# Patient Record
Sex: Female | Born: 1970 | Race: Black or African American | Hispanic: No | Marital: Single | Smoking: Never smoker
Health system: Southern US, Community
[De-identification: ages and names within clinical notes are randomized; demographics above are authoritative.]

## PROBLEM LIST (undated history)

## (undated) DIAGNOSIS — G8929 Other chronic pain: Secondary | ICD-10-CM

## (undated) DIAGNOSIS — M542 Cervicalgia: Secondary | ICD-10-CM

## (undated) DIAGNOSIS — R51 Headache: Secondary | ICD-10-CM

## (undated) DIAGNOSIS — G709 Myoneural disorder, unspecified: Secondary | ICD-10-CM

## (undated) HISTORY — PX: SHOULDER SURGERY: SHX246

---

## 2003-11-27 ENCOUNTER — Encounter: Admission: RE | Admit: 2003-11-27 | Discharge: 2003-12-14 | Payer: Self-pay | Admitting: Family Medicine

## 2004-04-10 ENCOUNTER — Emergency Department (HOSPITAL_COMMUNITY): Admission: EM | Admit: 2004-04-10 | Discharge: 2004-04-11 | Payer: Self-pay | Admitting: Emergency Medicine

## 2004-04-12 ENCOUNTER — Ambulatory Visit (HOSPITAL_COMMUNITY): Admission: EM | Admit: 2004-04-12 | Discharge: 2004-04-13 | Payer: Self-pay | Admitting: Orthopedic Surgery

## 2005-08-11 ENCOUNTER — Ambulatory Visit: Payer: Self-pay | Admitting: Family Medicine

## 2005-09-24 ENCOUNTER — Ambulatory Visit: Payer: Self-pay | Admitting: Family Medicine

## 2006-04-27 ENCOUNTER — Ambulatory Visit: Payer: Self-pay | Admitting: Family Medicine

## 2006-08-30 ENCOUNTER — Encounter: Payer: Self-pay | Admitting: Family Medicine

## 2006-08-30 ENCOUNTER — Other Ambulatory Visit: Admission: RE | Admit: 2006-08-30 | Discharge: 2006-08-30 | Payer: Self-pay | Admitting: Family Medicine

## 2006-08-30 ENCOUNTER — Ambulatory Visit: Payer: Self-pay | Admitting: Family Medicine

## 2006-08-30 ENCOUNTER — Encounter (INDEPENDENT_AMBULATORY_CARE_PROVIDER_SITE_OTHER): Payer: Self-pay | Admitting: *Deleted

## 2006-08-30 LAB — CONVERTED CEMR LAB: Pap Smear: NORMAL

## 2007-09-15 ENCOUNTER — Encounter: Payer: Self-pay | Admitting: Family Medicine

## 2007-12-30 ENCOUNTER — Ambulatory Visit (HOSPITAL_COMMUNITY): Admission: RE | Admit: 2007-12-30 | Discharge: 2007-12-30 | Payer: Self-pay | Admitting: Family Medicine

## 2007-12-30 ENCOUNTER — Ambulatory Visit: Payer: Self-pay | Admitting: Family Medicine

## 2007-12-30 LAB — CONVERTED CEMR LAB
Basophils Absolute: 0 10*3/uL (ref 0.0–0.1)
Basophils Relative: 0 % (ref 0–1)
Eosinophils Absolute: 0.1 10*3/uL (ref 0.0–0.7)
Hemoglobin: 11.4 g/dL — ABNORMAL LOW (ref 12.0–15.0)
Lymphocytes Relative: 15 % (ref 12–46)
MCV: 84.4 fL (ref 78.0–100.0)
Platelets: 277 10*3/uL (ref 150–400)
RBC: 4.17 M/uL (ref 3.87–5.11)
RDW: 14 % (ref 11.5–15.5)
WBC: 13.7 10*3/uL — ABNORMAL HIGH (ref 4.0–10.5)

## 2008-01-06 ENCOUNTER — Encounter (INDEPENDENT_AMBULATORY_CARE_PROVIDER_SITE_OTHER): Payer: Self-pay | Admitting: *Deleted

## 2008-01-06 DIAGNOSIS — H6690 Otitis media, unspecified, unspecified ear: Secondary | ICD-10-CM | POA: Insufficient documentation

## 2008-02-22 ENCOUNTER — Ambulatory Visit: Payer: Self-pay | Admitting: Family Medicine

## 2008-02-22 ENCOUNTER — Other Ambulatory Visit: Admission: RE | Admit: 2008-02-22 | Discharge: 2008-02-22 | Payer: Self-pay | Admitting: Family Medicine

## 2008-02-22 ENCOUNTER — Encounter: Payer: Self-pay | Admitting: Family Medicine

## 2008-04-12 ENCOUNTER — Telehealth: Payer: Self-pay | Admitting: Family Medicine

## 2008-09-28 ENCOUNTER — Ambulatory Visit: Payer: Self-pay | Admitting: Family Medicine

## 2008-09-28 DIAGNOSIS — J309 Allergic rhinitis, unspecified: Secondary | ICD-10-CM | POA: Insufficient documentation

## 2008-09-28 DIAGNOSIS — G44009 Cluster headache syndrome, unspecified, not intractable: Secondary | ICD-10-CM | POA: Insufficient documentation

## 2009-07-12 IMAGING — CR DG CHEST 2V
2 series · 2 of 2 positions shown · non-contrast
Comparison: None

CLINICAL DATA: Bronchitis, cough, congestion, fever, posterior
chest pain

CHEST - 2 VIEW

[view not recorded (1 of 2)]
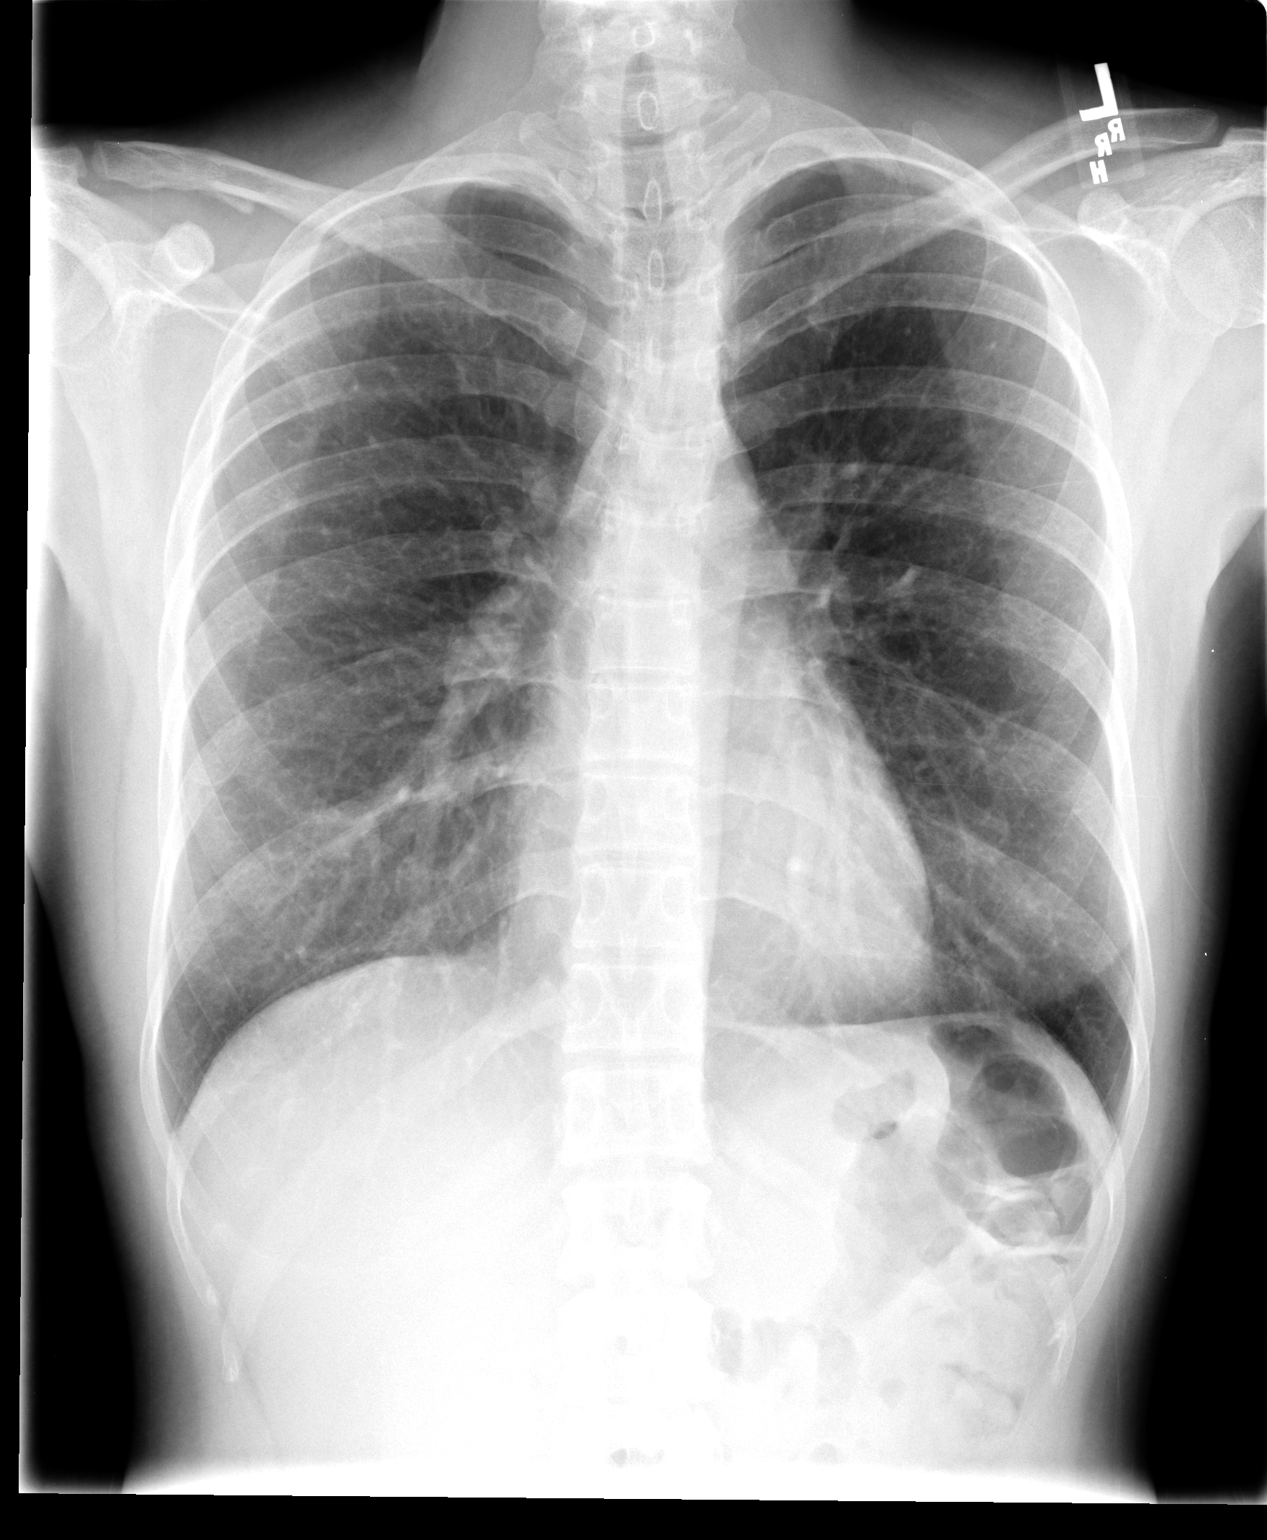

[view not recorded (2 of 2)]
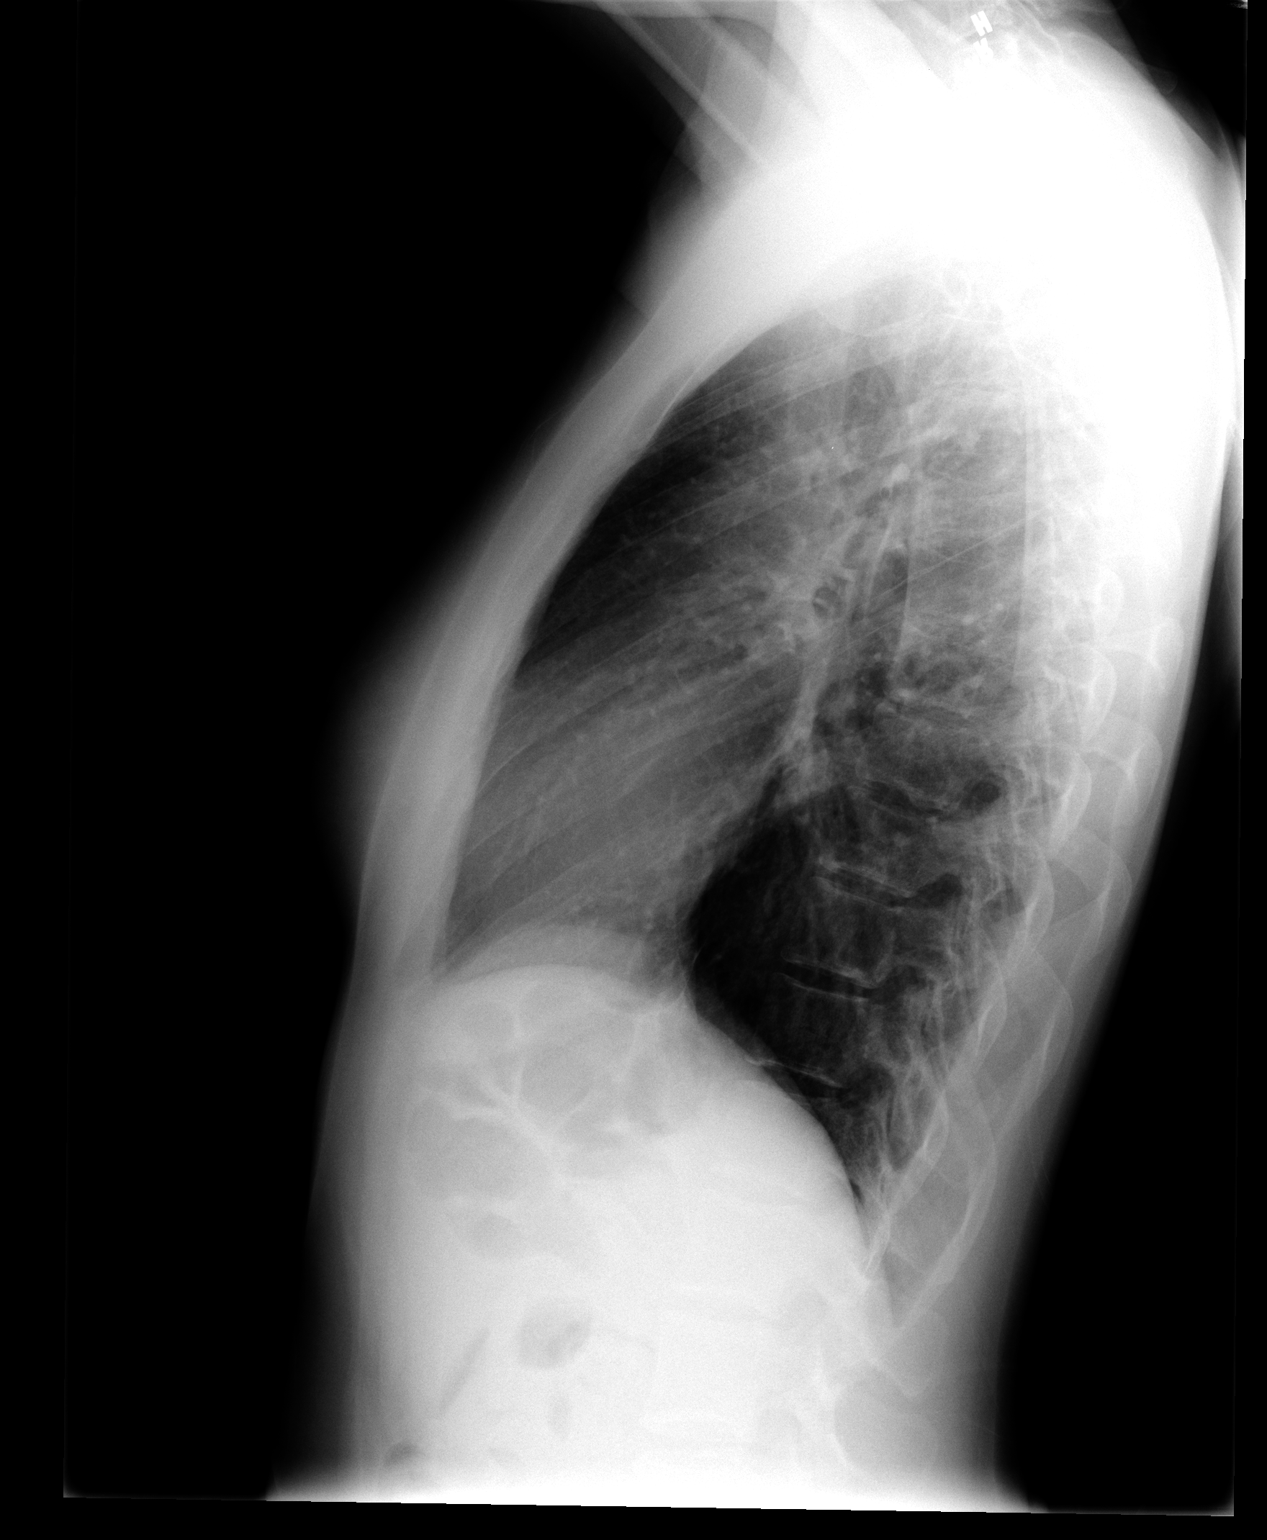

[2 of 2 positions shown; findings below may reference images not displayed]

FINDINGS: Normal heart size, mediastinal contours, and pulmonary vascularity.
Mild bronchitic changes without segmental infiltrate or pleural
effusion.
Very minimal interstitial prominence.
No pneumothorax or focal bone abnormality.
IMPRESSION: Mild bronchitic changes.

## 2009-07-26 ENCOUNTER — Telehealth: Payer: Self-pay | Admitting: Family Medicine

## 2009-10-01 ENCOUNTER — Encounter (INDEPENDENT_AMBULATORY_CARE_PROVIDER_SITE_OTHER): Payer: Self-pay | Admitting: *Deleted

## 2009-12-19 ENCOUNTER — Emergency Department (HOSPITAL_BASED_OUTPATIENT_CLINIC_OR_DEPARTMENT_OTHER): Admission: EM | Admit: 2009-12-19 | Discharge: 2009-12-19 | Payer: Self-pay | Admitting: Emergency Medicine

## 2009-12-20 ENCOUNTER — Ambulatory Visit: Payer: Self-pay | Admitting: Family Medicine

## 2009-12-20 DIAGNOSIS — J039 Acute tonsillitis, unspecified: Secondary | ICD-10-CM | POA: Insufficient documentation

## 2009-12-23 ENCOUNTER — Encounter: Payer: Self-pay | Admitting: Physician Assistant

## 2009-12-23 ENCOUNTER — Ambulatory Visit: Payer: Self-pay | Admitting: Family Medicine

## 2009-12-23 ENCOUNTER — Other Ambulatory Visit: Admission: RE | Admit: 2009-12-23 | Discharge: 2009-12-23 | Payer: Self-pay | Admitting: Family Medicine

## 2009-12-23 DIAGNOSIS — E785 Hyperlipidemia, unspecified: Secondary | ICD-10-CM | POA: Insufficient documentation

## 2009-12-23 LAB — CONVERTED CEMR LAB
Basophils Absolute: 0 10*3/uL (ref 0.0–0.1)
Basophils Relative: 0 % (ref 0–1)
Eosinophils Relative: 0 % (ref 0–5)
Hemoglobin: 11.3 g/dL — ABNORMAL LOW (ref 12.0–15.0)
Mono Screen: NEGATIVE
Neutrophils Relative %: 74 % (ref 43–77)
Platelets: 248 10*3/uL (ref 150–400)
RBC: 4.21 M/uL (ref 3.87–5.11)
RDW: 14.5 % (ref 11.5–15.5)

## 2010-01-07 ENCOUNTER — Encounter: Payer: Self-pay | Admitting: Family Medicine

## 2010-01-20 ENCOUNTER — Encounter: Payer: Self-pay | Admitting: Family Medicine

## 2010-01-20 ENCOUNTER — Encounter: Payer: Self-pay | Admitting: Physician Assistant

## 2010-01-20 LAB — CONVERTED CEMR LAB: Pap Smear: UNDETERMINED

## 2010-01-23 ENCOUNTER — Telehealth: Payer: Self-pay | Admitting: Physician Assistant

## 2010-06-06 ENCOUNTER — Encounter: Payer: Self-pay | Admitting: Family Medicine

## 2010-10-14 NOTE — Letter (Signed)
Summary: consults  consults   Imported By: Lind Guest 03/07/2010 15:51:47  _____________________________________________________________________  External Attachment:    Type:   Image     Comment:   External Document

## 2010-10-14 NOTE — Letter (Signed)
Summary: Laboratory/X-Ray Results  [facility]  [address]   [place], [place] [address]   Phone: [phone]  Fax: [phone]    Lab/X-Ray Results  Jan 20, 2010  MRN: [id number]  Carla [name] [address] [place], [place]  [address]   Chip Boer -  I have tried to reach you by phone and mail to discuss your pap smear results.  Your pap smear did show a mild abnomality.  I need to discuss these results with your and refer you to a gynecologist for further testing/evaluation.  Please contact the office as soon as possible at [phone].    Esperanza Sheets PA  Appended Document: Laboratory/X-Ray Results Sent Certified Mail.

## 2010-10-14 NOTE — Assessment & Plan Note (Signed)
Summary: sick- room 1   Vital Signs:  Patient profile:   40 year old female Height:      64.5 inches Weight:      125.75 pounds BMI:     21.33 O2 Sat:      99 % on Room air Pulse rate:   100 / minute Resp:     16 per minute BP sitting:   120 / 70  (left arm)  Vitals Entered By: Adella Hare LPN (December 21, 6043 10:37 AM) CC: sore throat, body aches, headaches Is Patient Diabetic? No Pain Assessment Patient in pain? no        CC:  sore throat, body aches, and headaches.  History of Present Illness: Pt presents today with c/o swelling & sore thrt Rt side only.  Also body aches.  Started 2 days ago. Yest felt worse so went to urgent care.  Dx of strep throat, but didnt have a strep test.  Was given shot of Penicillin & told her she'd feel better today.  Pt states she feels worse today.  Swelling in throat is worse, thrt still sore, some post nasal drainage. Still has body aches, & running low grade fever.  She is using otc pain relievers without much help.  Current Medications (verified): 1)  Flonase 50 Mcg/act Susp (Fluticasone Propionate) .... Two Puffs Per Nostril Qd 2)  Fioricet 50-325-40 Mg Tabs (Butalbital-Apap-Caffeine) .... One Tab By Mouth Two Times A Day As Needed Severe Headache 3)  Ibuprofen 800 Mg Tabs (Ibuprofen) .... One Tab By Mouth Two Times A Day Prn  Allergies (verified): No Known Drug Allergies  Past History:  Past medical history reviewed for relevance to current acute and chronic problems.  Past Medical History: Reviewed history from 09/28/2008 and no changes required. Current Problems:  OTITIS MEDIA, ACUTE, RIGHT (ICD-382.9) migraine headache  2002 left TMJ  09/2008  Review of Systems General:  Complains of chills and fever. ENT:  Complains of earache, postnasal drainage, and sore throat; denies nasal congestion and sinus pressure. CV:  Denies chest pain or discomfort. Resp:  Denies shortness of breath. GI:  Complains of loss of appetite;  denies nausea and vomiting.  Physical Exam  General:  well-developed, well-nourished, well-hydrated, and uncomfortable-appearing.   Head:  Normocephalic and atraumatic without obvious abnormalities. No apparent alopecia or balding. Ears:  External ear exam shows no significant lesions or deformities.  Otoscopic examination reveals clear canals, tympanic membranes are intact bilaterally without bulging, retraction, inflammation or discharge. Hearing is grossly normal bilaterally. Nose:  External nasal examination shows no deformity or inflammation. Nasal mucosa are pink and moist without lesions or exudates. Mouth:  good dentition and no postnasal drip.  Rt tonsil is 2+ with exudate, erythema noted of soft palate in immediate area of tonsil but no swelling or diplacement of uvula noted.  Lt tonsil 1+ with exudate, though less than Rt. Neck:  No deformities, masses, or tenderness noted. Lungs:  Normal respiratory effort, chest expands symmetrically. Lungs are clear to auscultation, no crackles or wheezes. Heart:  Normal rate and regular rhythm. S1 and S2 normal without gallop, murmur, click, rub or other extra sounds. Abdomen:  soft, non-tender, no hepatomegaly, and no splenomegaly.   Cervical Nodes:  Shotty bilat tonsilar nodes.  No cervical adenopathy noted. Psych:  Cognition and judgment appear intact. Alert and cooperative with normal attention span and concentration. No apparent delusions, illusions, hallucinations   Impression & Recommendations:  Problem # 1:  TONSILLITIS, ACUTE (ICD-463) Assessment  New Discussed with pt that she does have an infection of her tonsils.  We need to r/o Mono since she is worsening despite recent antibiotic injection.   Also discussed concern for development of peritonsilar abscess since her syptoms are so much worse for the Rt side.  Discussed with pt that if she has worsening of fever, or increased swelling & pain on Rt side that she needs to be seen at  ER.  Orders: T-CBC w/Diff ([id number]) Monospot-[facility] ([address]) Rocephin  250mg  ([id number]) Admin of Therapeutic Inj  intramuscular or subcutaneous ([address])  Complete Medication List: 1)  Flonase 50 Mcg/act Susp (Fluticasone propionate) .... Two puffs per nostril qd 2)  Fioricet 50-325-40 Mg Tabs (Butalbital-apap-caffeine) .... One tab by mouth two times a day as needed severe headache 3)  Ibuprofen 800 Mg Tabs (Ibuprofen) .... One tab by mouth two times a day prn 4)  Augmentin 875-125 Mg Tabs (Amoxicillin-pot clavulanate) .... Take 1 every 12 hrs with food x 10 days 5)  Vicodin 5-500 Mg Tabs (Hydrocodone-acetaminophen) .... Take 1 every 6 hrs as needed for pain 6)  Fluconazole 150 Mg Tabs (Fluconazole) .... Take 1 orally as needed for yeast infection  Patient Instructions: 1)  Recheck appt Monday morning. 2)  Take Ibuprofen 800 mg three times a day as needed for pai. 3)  I have prescribed Augmentin for your antibiotics, Vicoden to use as needed for pain in addition to Ibuprofen, and Diflucan if you develop yeast infection syptoms. 4)  Drink alot of fluids. 5)  Monitor your temp.  If your fever worsens, or you develop increased pain & swelling on the Rt side of your throat you need to go to the ER. Prescriptions: FLUCONAZOLE 150 MG TABS (FLUCONAZOLE) take 1 orally as needed for yeast infection  #2 x 0   Entered and Authorized by:   Esperanza Sheets PA   Signed by:   Esperanza Sheets PA on 12/20/2009   Method used:   Printed then faxed to ...       [facility]* (retail)       [address] [place], [place]  [address]       Ph: [phone]       Fax: [phone]   RxID:   [id number] VICODIN 5-500 MG TABS (HYDROCODONE-ACETAMINOPHEN) take 1 every 6 hrs as needed for pain  #20 x 0   Entered and Authorized by:   Esperanza Sheets PA   Signed by:   Esperanza Sheets PA on 12/20/2009   Method used:   Printed then faxed to ...       [facility]* (retail)       [address] [place], [place]   [address]       Ph: [phone]       Fax: [phone]   RxID:   [id number] AUGMENTIN 875-125 MG TABS (AMOXICILLIN-POT CLAVULANATE) take 1 every 12 hrs with food x 10 days  #20 x 0   Entered and Authorized by:   Esperanza Sheets PA   Signed by:   Esperanza Sheets PA on 12/20/2009   Method used:   Printed then faxed to ...       [facility]* (retail)       [address] [place], [place]  [address]       Ph: [phone]       Fax: [phone]   RxID:   [id number]  Medication Administration  Injection # 1:    Medication: Rocephin  250mg     Diagnosis: TONSILLITIS, ACUTE (ICD-463)    Route: IM    Site: LUOQ gluteus    Exp Date: 8/13    Lot #: [id number]    Mfr: [email or web address]    Comments: one gram given    Patient tolerated injection without complications    Given by: Adella Hare LPN (December 20, 1476 1:12 PM)  Orders Added: 1)  T-CBC w/Diff [[id number]] 2)  Monospot-[facility] [57846] 3)  Est. Patient Level IV [[address]] 4)  Rocephin  250mg  [J0696] 5)  Admin of Therapeutic Inj  intramuscular or subcutaneous [28413]

## 2010-10-14 NOTE — Letter (Signed)
Summary: x rays  x rays   Imported By: Lind Guest 03/07/2010 15:58:08  _____________________________________________________________________  External Attachment:    Type:   Image     Comment:   External Document

## 2010-10-14 NOTE — Letter (Signed)
Summary: history and physical  history and physical   Imported By: Lind Guest 03/07/2010 15:53:46  _____________________________________________________________________  External Attachment:    Type:   Image     Comment:   External Document

## 2010-10-14 NOTE — Progress Notes (Signed)
Summary: returning a call from a letter that was sent  Phone Note Call from Patient   Summary of Call: called and said that you had sent her a letter and signed it that you had been trying to contact her call back at [phone] Initial call taken by: Lind Guest,  Jan 23, 2010 8:48 AM  Follow-up for Phone Call        Discussed abn pap with pt. Discussed HPV. Advised pt she needs referral to GYN for colposcopy.  Pt requests Dr Cherly Hensen. Follow-up by: Esperanza Sheets PA,  Jan 23, 2010 9:44 AM     Appended Document: returning a call from a letter that was sent sent information over to dr. cousins office. they will call pt with appt and time. also called and left message for [facility]. pt notified

## 2010-10-14 NOTE — Letter (Signed)
Summary: phone notes  phone notes   Imported By: Lind Guest 03/07/2010 15:56:49  _____________________________________________________________________  External Attachment:    Type:   Image     Comment:   External Document

## 2010-10-14 NOTE — Assessment & Plan Note (Signed)
Summary: physical- room 3   Vital Signs:  Patient profile:   40 year old female Menstrual status:  regular LMP:     12/06/2009 Height:      64.5 inches Weight:      127.50 pounds BMI:     21.63 O2 Sat:      99 % on Room air Pulse rate:   102 / minute Resp:     16 per minute BP sitting:   110 / 60  (left arm)  Vitals Entered By: Adella Hare LPN (December 23, 2009 10:51 AM) CC: physical Is Patient Diabetic? No Pain Assessment Patient in pain? no      LMP (date): 12/06/2009     Menstrual Status regular Enter LMP: 12/06/2009 Last PAP Result Normal   CC:  physical.  History of Present Illness: Pt is here today for follow up of her recent tonsillitis & a physical. She states the swelling & pain in her throat is significantly better.  She noticed 50% improv about 24 hrs after being seen in the office, and has contnued to improve since that time Lab results were reviewed.  Hx of migraines.  One every 6 mos.  Relieved with 800 mg Ibuprofen. Had labs done thru her employer last fall.  States her cholesterol levels were a little high.  Menses regular. Using condoms.  Not interested in other birth control options at this time. No SBE's Eye exam - no prev. Dental exam q 6 mos. Last Tetnus >10 yrs.   Current Medications (verified): 1)  Flonase 50 Mcg/act Susp (Fluticasone Propionate) .... Two Puffs Per Nostril Qd 2)  Fioricet 50-325-40 Mg Tabs (Butalbital-Apap-Caffeine) .... One Tab By Mouth Two Times A Day As Needed Severe Headache 3)  Ibuprofen 800 Mg Tabs (Ibuprofen) .... One Tab By Mouth Two Times A Day Prn 4)  Augmentin 875-125 Mg Tabs (Amoxicillin-Pot Clavulanate) .... Take 1 Every 12 Hrs With Food X 10 Days 5)  Vicodin 5-500 Mg Tabs (Hydrocodone-Acetaminophen) .... Take 1 Every 6 Hrs As Needed For Pain 6)  Fluconazole 150 Mg Tabs (Fluconazole) .... Take 1 Orally As Needed For Yeast Infection  Allergies (verified): No Known Drug Allergies  Past History:  Past  medical, surgical, family and social histories (including risk factors) reviewed, and no changes noted (except as noted below).  Past Medical History: migraine headache  2002  Past Surgical History: Reviewed history from 01/06/2008 and no changes required. Fractured right clavicle (2005)  Family History: Reviewed history from 01/06/2008 and no changes required. Mom Rheu Artithis Dad increased lipid, HTN Sister 1 healthy Brother 1 healthy  Social History: Reviewed history from 01/06/2008 and no changes required. Employed Single 1 child Never Smoked Alcohol use-no Drug use-no  Review of Systems General:  Denies chills and fever. Eyes:  Denies blurring and double vision. ENT:  Complains of sore throat; denies earache, nasal congestion, and nosebleeds; SORE THROAT SIGNIFICANTLY IMPROVED FROM PREV VISIT. CV:  Denies chest pain or discomfort and palpitations. Resp:  Denies shortness of breath. GI:  Denies bloody stools, change in bowel habits, constipation, dark tarry stools, diarrhea, indigestion, nausea, and vomiting. GU:  Denies abnormal vaginal bleeding, discharge, dysuria, incontinence, and urinary frequency. MS:  Denies joint pain, low back pain, and mid back pain. Derm:  Complains of lesion(s) and rash. Psych:  Denies anxiety and depression. Allergy:  Complains of seasonal allergies.  Physical Exam  General:  Well-developed,well-nourished,in no acute distress; alert,appropriate and cooperative throughout examination Head:  Normocephalic and atraumatic without obvious  abnormalities. No apparent alopecia or balding. Eyes:  pupils equal, pupils round, pupils reactive to light, and no injection.   Ears:  External ear exam shows no significant lesions or deformities.  Otoscopic examination reveals clear canals, tympanic membranes are intact bilaterally without bulging, retraction, inflammation or discharge. Hearing is grossly normal bilaterally. Nose:  External nasal  examination shows no deformity or inflammation. Nasal mucosa are pink and moist without lesions or exudates. Mouth:  good dentition, no postnasal drip, no tongue abnormalities, and no leukoplakia.  Significant decrease in swelling of tonsils noted.  Small amt of erythema of Rt tonsil still noted.  No erythema or swelling of soft palate. Neck:  No deformities, masses, or tenderness noted. Chest Wall:  no deformities and no mass.   Breasts:  No mass, nodules, thickening, tenderness, bulging, retraction, inflamation, nipple discharge or skin changes noted.   Lungs:  Normal respiratory effort, chest expands symmetrically. Lungs are clear to auscultation, no crackles or wheezes. Heart:  Normal rate and regular rhythm. S1 and S2 normal without gallop, murmur, click, rub or other extra sounds. Abdomen:  Bowel sounds positive,abdomen soft and non-tender without masses, organomegaly or hernias noted. Genitalia:  Normal introitus for age, no external lesions, no vaginal discharge, mucosa pink and moist, no vaginal or cervical lesions, no vaginal atrophy, no friaility or hemorrhage, normal uterus size and position, no adnexal masses or tenderness Pulses:  R posterior tibial normal, R dorsalis pedis normal, L posterior tibial normal, and L dorsalis pedis normal.   Neurologic:  alert & oriented X3, sensation intact to light touch, gait normal, and DTRs symmetrical and normal.   Skin:  Intact without suspicious lesions or rashes Cervical Nodes:  No lymphadenopathy noted Axillary Nodes:  No palpable lymphadenopathy Psych:  Cognition and judgment appear intact. Alert and cooperative with normal attention span and concentration. No apparent delusions, illusions, hallucinations   Impression & Recommendations:  Problem # 1:  Preventive Health Care (ICD-V70.0) Encouraged periodic SBE & routine eye exam.  Problem # 2:  TONSILLITIS, ACUTE (ICD-463) Assessment: Improved Will complete antibiotics as  prescribed.  Problem # 3:  HYPERLIPIDEMIA (ICD-272.4) By hx.  Orders: T-Comprehensive Metabolic Panel [phone] [id number]) T-Lipid Profile [phone])  Complete Medication List: 1)  Flonase 50 Mcg/act Susp (Fluticasone propionate) .... Two puffs per nostril qd 2)  Fioricet 50-325-40 Mg Tabs (Butalbital-apap-caffeine) .... One tab by mouth two times a day as needed severe headache 3)  Ibuprofen 800 Mg Tabs (Ibuprofen) .... One tab by mouth two times a day prn 4)  Augmentin 875-125 Mg Tabs (Amoxicillin-pot clavulanate) .... Take 1 every 12 hrs with food x 10 days 5)  Vicodin 5-500 Mg Tabs (Hydrocodone-acetaminophen) .... Take 1 every 6 hrs as needed for pain 6)  Fluconazole 150 Mg Tabs (Fluconazole) .... Take 1 orally as needed for yeast infection  Other Orders: T-TSH [id number]) T-Vitamin D (25-Hydroxy) [id number]) Tdap => 13yrs IM (44010) Admin 1st Vaccine ([address]) Pap Smear (66440)  Patient Instructions: 1)  Physical in one year. 2)  You should check your breasts at home periodically. 3)  If you decide that you are interested in other birth control methods please return to discuss options.   Immunizations Administered:  Tetanus Vaccine:    Vaccine Type: Tdap    Site: right deltoid    Mfr: [email or web address]    Dose: 0.5 ml    Route: IM    Given by: Adella Hare LPN    Exp. Date: 12/07/2011    Lot #: [id number]  VIS given: 08/02/07 version given December 23, 2009.

## 2010-10-14 NOTE — Letter (Signed)
Summary: labs  labs   Imported By: Lind Guest 03/07/2010 15:55:14  _____________________________________________________________________  External Attachment:    Type:   Image     Comment:   External Document

## 2010-10-14 NOTE — Letter (Signed)
Summary: Letter  Letter   Imported By: Lind Guest 01/07/2010 16:23:43  _____________________________________________________________________  External Attachment:    Type:   Image     Comment:   External Document

## 2010-10-14 NOTE — Letter (Signed)
Summary: demo  demo   Imported By: Lind Guest 03/07/2010 15:52:37  _____________________________________________________________________  External Attachment:    Type:   Image     Comment:   External Document

## 2010-10-14 NOTE — Letter (Signed)
Summary: Out of Work  [facility]  [address]   [place], [place] [address]   Phone: [phone]  Fax: [phone]    December 23, 2009   Employee:  Carla [name] [facility]    To Whom It May Concern:   For Medical reasons, please excuse the above named employee from work for the following dates:  Start:   12/20/09  End:   12/24/09 may return to work without restriction.  If you need additional information, please feel free to contact our office.         Sincerely,    Esperanza Sheets PA

## 2010-10-14 NOTE — Letter (Signed)
Summary: misc  misc   Imported By: Lind Guest 03/07/2010 15:55:40  _____________________________________________________________________  External Attachment:    Type:   Image     Comment:   External Document

## 2010-10-14 NOTE — Letter (Signed)
Summary: 1st Missed Appt.  [facility]  [address]   [place], [place] [address]   Phone: [phone]  Fax: [phone]    October 01, 2009  MRN: [id number]  [facility] [address] [place], [place]  [address]  Dear Ms. Carla [name],  At [facility], we make every attempt to fit patients into our schedule by reserving several appointment slots for same-day appointments.  However, we cannot always make appointments for patients the same day they are calling.  At the end of the day, we look back at our schedule and find that because of last-minute cancellations and patients not showing up for their scheduled appointments, we have several appointment slots that are left open and could have been used by another person who really needed it.  In the past, you may have been one of the patients who could not get in when you needed to.  But recently, you were one of the patients with an appointment that you didn't show up for or canceled too late for [place] to fill it.  We choose not to charge no-show or last minute cancellation fees to our patients, like many other offices do.  We do not wish to institute that policy and hope we never have to.  However, we kindly request that you assist [place] by providing at least 24 hours' notice if you can't make your appointment.  If no-shows or late cancellations become habitual (i.e. Three or more in a one-year period), we may terminate the physician-patient relationship.    Thank you for your consideration and cooperation.   [name]

## 2010-10-14 NOTE — Letter (Signed)
Summary: MEDICAL RELEASE INFO  MEDICAL RELEASE INFO   Imported By: Lind Guest 06/06/2010 09:43:05  _____________________________________________________________________  External Attachment:    Type:   Image     Comment:   External Document

## 2010-10-14 NOTE — Letter (Signed)
Summary: certified mail receipt  certified mail receipt   Imported By: Lind Guest 01/23/2010 10:39:12  _____________________________________________________________________  External Attachment:    Type:   Image     Comment:   External Document

## 2010-10-14 NOTE — Letter (Signed)
Summary: office notes  office notes   Imported By: Lind Guest 03/07/2010 15:56:10  _____________________________________________________________________  External Attachment:    Type:   Image     Comment:   External Document

## 2011-01-30 NOTE — Op Note (Signed)
NAME:  Carla [name], Carla [name]                         ACCOUNT NO.:  [id number]   MEDICAL RECORD NO.:  [id number]                   PATIENT TYPE:  OIB   LOCATION:  2852                                 FACILITY:  [facility]   PHYSICIAN:  Vania Rea. Supple, M.D.               DATE OF BIRTH:  1971-08-08   DATE OF PROCEDURE:  04/12/2004  DATE OF DISCHARGE:                                 OPERATIVE REPORT   PREOPERATIVE DIAGNOSIS:  Displaced right type 2 distal clavicle fracture.   POSTOPERATIVE DIAGNOSIS:  Displaced right type 2 distal clavicle fracture.   PROCEDURE:  Open reduction and internal fixation of displaced right type 2  distal clavicle fracture utilizing a coracoclavicular screw.   SURGEON:  Vania Rea. Supple, M.D.   ASSISTANT:  Dava Najjar, P.A.-C.   ANESTHESIA:  General endotracheal as well as an interscalene block.   ESTIMATED BLOOD LOSS:  150 mL.   DRAINS:  None.   HISTORY:  Carla [name] is a 40 year old female who sustained a right shoulder  injury with a displaced type 2 distal clavicle fracture.  Due to the degree  of displacement and instability of this fracture pattern, we have discussed  ORIF.  The possible surgical complications of bleeding, infection,  neurovascular injury, nonunion, malunion, loss of fixation and possible need  for hardware removal and the potential for anesthetic complications were  reviewed.  She understands, accepts, and agrees to the planned procedure.   PROCEDURE IN DETAIL:  After undergoing routine preoperative evaluation, the  patient received prophylactic antibiotics.  An interscalene block was  established in the holding area by the anesthesia department.  She was  placed supine on the radiolucent operating table and underwent the smooth  induction of general endotracheal anesthesia.  The right shoulder girdle  region was sterilely prepped and draped in a standard fashion.  A 4 cm  incision was then made beginning at the level of the fracture  site  superiorly and extending distally towards the coracoid process in line with  the skin folds of the right shoulder.  0.5% Marcaine with epinephrine was  instilled along the skin incision prior to incision to help obtain  hemostasis.  Sharp dissection was carried down through the skin and  subcutaneous tissues and skin flaps were mobilized.  The deltopectoral  fascia was divided and the deltoid was reflected anteriorly and the  trapezius reflected posteriorly to allow exposure of the clavicle at the  fracture site.  Dissection was then carried deeply in the interval following  the coracoclavicular ligaments down to the superior aspect of the coracoid  process.  Direct reduction was then visualized and a 3/16 drill hole was  then made through the distal clavicle overlying the coracoid.  This was then  followed with a 9/64 drill passed through the base of the coracoid.  Under  fluoroscopic guidance, we confirmed proper positioning of the drill holes.  A 40  mm DePuy coracoclavicular screw with a washer was then passed through  the clavicle and then down into the base of the coracoid and under direct  visualization as well as radiographic imaging, we confirmed that the  coracoclavicular screw passed into good position with good purchase into the  coracoid and maintained good direction of the distal clavicle fracture.  Once this was completed, then a suture of #2 FiberWire was passed  circumferentially around the distal clavicle fragment and then around the  base of the screw to allow further reduction of the distal clavicle fragment  to the proximal segment obtaining good bony contact.  Final fluoroscopic  images were then obtained showing good alignment of the fracture site and  good position of the hardware.  The wound was then copiously irrigated.  Hemostasis was obtained.  The deltopectoral fascia was closed with  interrupted figure-of-eight #1 Vicryl sutures, 2-0 Vicryl was used for  the  subcu, and intracuticular 3-0 Monocryl used for the skin followed by Steri-  Strips.  A dry dressing was placed over the incision, the right arm was  placed in a sling.  The patient was then extubated and taken to the recovery  room in stable condition.                                               Vania Rea. Supple, M.D.    KMS/MEDQ  D:  04/12/2004  T:  04/12/2004  Job:  [id number]

## 2012-08-30 ENCOUNTER — Other Ambulatory Visit (HOSPITAL_COMMUNITY): Payer: Self-pay | Admitting: Orthopaedic Surgery

## 2012-09-05 ENCOUNTER — Encounter (HOSPITAL_COMMUNITY): Payer: Self-pay | Admitting: Pharmacy Technician

## 2012-09-05 ENCOUNTER — Encounter (HOSPITAL_COMMUNITY)
Admission: RE | Admit: 2012-09-05 | Discharge: 2012-09-05 | Disposition: A | Discharge status: Home / Self Care | Payer: Managed Care, Other (non HMO) | Source: Ambulatory Visit | Attending: Orthopaedic Surgery | Admitting: Orthopaedic Surgery

## 2012-09-05 ENCOUNTER — Encounter (HOSPITAL_COMMUNITY): Payer: Self-pay

## 2012-09-05 HISTORY — DX: Other chronic pain: G89.29

## 2012-09-05 HISTORY — DX: Cervicalgia: M54.2

## 2012-09-05 HISTORY — DX: Headache: R51

## 2012-09-05 HISTORY — DX: Myoneural disorder, unspecified: G70.9

## 2012-09-05 LAB — COMPREHENSIVE METABOLIC PANEL
Albumin: 3.7 g/dL (ref 3.5–5.2)
BUN: 12 mg/dL (ref 6–23)
Chloride: 100 mEq/L (ref 96–112)
Creatinine, Ser: 0.88 mg/dL (ref 0.50–1.10)
GFR calc non Af Amer: 81 mL/min — ABNORMAL LOW (ref >90)
Total Bilirubin: 0.3 mg/dL (ref 0.3–1.2)

## 2012-09-05 LAB — URINALYSIS, ROUTINE W REFLEX MICROSCOPIC
Bilirubin Urine: NEGATIVE
Ketones, ur: NEGATIVE mg/dL
Nitrite: NEGATIVE
Specific Gravity, Urine: 1.018 (ref 1.005–1.030)
Urobilinogen, UA: 0.2 mg/dL (ref 0.0–1.0)

## 2012-09-05 LAB — CBC
HCT: 33.1 % — ABNORMAL LOW (ref 36.0–46.0)
MCH: 27.1 pg (ref 26.0–34.0)
MCV: 83 fL (ref 78.0–100.0)
RDW: 13.9 % (ref 11.5–15.5)
WBC: 9 10*3/uL (ref 4.0–10.5)

## 2012-09-05 LAB — URINE MICROSCOPIC-ADD ON

## 2012-09-05 LAB — PROTIME-INR
INR: 1 (ref 0.00–1.49)
Prothrombin Time: 13.1 seconds (ref 11.6–15.2)

## 2012-09-05 NOTE — Pre-Procedure Instructions (Signed)
20 Meggan Dhaliwal  09/05/2012   Your procedure is scheduled on:  Monday  September 12, 2012  Report to [facility] at 5:30 AM.  Call this number if you have problems the morning of surgery: [phone]   Remember:   Do not eat food or drink :After Midnight.    Take these medicines the morning of surgery with A SIP OF WATER: none   Do not wear jewelry, make-up or nail polish.  Do not wear lotions, powders, or perfumes.  Do not shave 48 hours prior to surgery.  Do not bring valuables to the hospital.  Contacts, dentures or bridgework may not be worn into surgery.  Leave suitcase in the car. After surgery it may be brought to your room.  For patients admitted to the hospital, checkout time is 11:00 AM the day of discharge.   Patients discharged the day of surgery will not be allowed to drive home.  Name and phone number of your driver: family / friend  Special Instructions: Shower using CHG 2 nights before surgery and the night before surgery.  If you shower the day of surgery use CHG.  Use special wash - you have one bottle of CHG for all showers.  You should use approximately 1/3 of the bottle for each shower.   Please read over the following fact sheets that you were given: Pain Booklet, Coughing and Deep Breathing, MRSA Information and Surgical Site Infection Prevention

## 2012-09-11 MED ORDER — CEFAZOLIN SODIUM-DEXTROSE 2-3 GM-% IV SOLR
2.0000 g | INTRAVENOUS | Status: AC
  Administered 2012-09-12: 2 g via INTRAVENOUS
  Filled 2012-09-11: qty 50

## 2012-09-12 ENCOUNTER — Encounter (HOSPITAL_COMMUNITY)
Admission: RE | Disposition: A | Discharge status: Home / Self Care | Payer: Self-pay | Source: Ambulatory Visit | Attending: Orthopaedic Surgery

## 2012-09-12 ENCOUNTER — Ambulatory Visit (HOSPITAL_COMMUNITY): Payer: Managed Care, Other (non HMO) | Admitting: Anesthesiology

## 2012-09-12 ENCOUNTER — Encounter (HOSPITAL_COMMUNITY): Payer: Self-pay | Admitting: Surgery

## 2012-09-12 ENCOUNTER — Ambulatory Visit (HOSPITAL_COMMUNITY): Payer: Managed Care, Other (non HMO)

## 2012-09-12 ENCOUNTER — Encounter (HOSPITAL_COMMUNITY): Payer: Self-pay | Admitting: Anesthesiology

## 2012-09-12 ENCOUNTER — Observation Stay (HOSPITAL_COMMUNITY)
Admission: RE | Admit: 2012-09-12 | Discharge: 2012-09-13 | DRG: 473 | Disposition: A | Discharge status: Home / Self Care | Payer: Managed Care, Other (non HMO) | Source: Ambulatory Visit | Attending: Orthopaedic Surgery | Admitting: Orthopaedic Surgery

## 2012-09-12 DIAGNOSIS — M4722 Other spondylosis with radiculopathy, cervical region: Secondary | ICD-10-CM

## 2012-09-12 DIAGNOSIS — M47812 Spondylosis without myelopathy or radiculopathy, cervical region: Principal | ICD-10-CM | POA: Insufficient documentation

## 2012-09-12 DIAGNOSIS — Z01812 Encounter for preprocedural laboratory examination: Secondary | ICD-10-CM | POA: Insufficient documentation

## 2012-09-12 HISTORY — PX: ANTERIOR CERVICAL DECOMP/DISCECTOMY FUSION: SHX1161

## 2012-09-12 SURGERY — ANTERIOR CERVICAL DECOMPRESSION/DISCECTOMY FUSION 2 LEVELS
Anesthesia: General | Site: Neck | Wound class: Clean

## 2012-09-12 MED ORDER — OXYCODONE-ACETAMINOPHEN 5-325 MG PO TABS: 1.0000 | ORAL_TABLET | ORAL | Status: DC | PRN

## 2012-09-12 MED ORDER — SODIUM CHLORIDE 0.9 % IJ SOLN
3.0000 mL | Freq: Two times a day (BID) | INTRAMUSCULAR | Status: DC
  Administered 2012-09-12 – 2012-09-13 (×2): 3 mL via INTRAVENOUS

## 2012-09-12 MED ORDER — 0.9 % SODIUM CHLORIDE (POUR BTL) OPTIME
TOPICAL | Status: DC | PRN
  Administered 2012-09-12: 1000 mL

## 2012-09-12 MED ORDER — ACETAMINOPHEN 325 MG PO TABS
650.0000 mg | ORAL_TABLET | ORAL | Status: DC | PRN
  Filled 2012-09-12: qty 2

## 2012-09-12 MED ORDER — CEFAZOLIN SODIUM 1-5 GM-% IV SOLN
1.0000 g | Freq: Three times a day (TID) | INTRAVENOUS | Status: DC
  Filled 2012-09-12 (×2): qty 50

## 2012-09-12 MED ORDER — METHOCARBAMOL 100 MG/ML IJ SOLN
500.0000 mg | Freq: Four times a day (QID) | INTRAVENOUS | Status: DC | PRN
  Administered 2012-09-12: 500 mg via INTRAVENOUS
  Filled 2012-09-12 (×2): qty 5

## 2012-09-12 MED ORDER — SENNOSIDES-DOCUSATE SODIUM 8.6-50 MG PO TABS: 1.0000 | ORAL_TABLET | Freq: Every evening | ORAL | Status: DC | PRN

## 2012-09-12 MED ORDER — KETOROLAC TROMETHAMINE 30 MG/ML IJ SOLN: 30.0000 mg | Freq: Once | INTRAMUSCULAR | Status: DC

## 2012-09-12 MED ORDER — CEFAZOLIN SODIUM 1-5 GM-% IV SOLN
1.0000 g | Freq: Three times a day (TID) | INTRAVENOUS | Status: AC
  Administered 2012-09-12 – 2012-09-13 (×2): 1 g via INTRAVENOUS
  Filled 2012-09-12 (×3): qty 50

## 2012-09-12 MED ORDER — KCL IN DEXTROSE-NACL 20-5-0.45 MEQ/L-%-% IV SOLN
INTRAVENOUS | Status: DC
  Filled 2012-09-12 (×4): qty 1000

## 2012-09-12 MED ORDER — NEOSTIGMINE METHYLSULFATE 1 MG/ML IJ SOLN
INTRAMUSCULAR | Status: DC | PRN
  Administered 2012-09-12: 3 mg via INTRAVENOUS

## 2012-09-12 MED ORDER — FLEET ENEMA 7-19 GM/118ML RE ENEM: 1.0000 | ENEMA | Freq: Once | RECTAL | Status: AC | PRN

## 2012-09-12 MED ORDER — LIDOCAINE HCL 4 % MT SOLN
OROMUCOSAL | Status: DC | PRN
  Administered 2012-09-12: 4 mL via TOPICAL

## 2012-09-12 MED ORDER — LACTATED RINGERS IV SOLN
INTRAVENOUS | Status: DC | PRN
  Administered 2012-09-12 (×2): via INTRAVENOUS

## 2012-09-12 MED ORDER — LACTATED RINGERS IV SOLN
INTRAVENOUS | Status: DC
  Administered 2012-09-12: 12:00:00 via INTRAVENOUS

## 2012-09-12 MED ORDER — ACETAMINOPHEN 650 MG RE SUPP: 650.0000 mg | RECTAL | Status: DC | PRN

## 2012-09-12 MED ORDER — ONDANSETRON HCL 4 MG/2ML IJ SOLN
4.0000 mg | INTRAMUSCULAR | Status: DC | PRN
  Administered 2012-09-12: 4 mg via INTRAVENOUS
  Filled 2012-09-12: qty 2

## 2012-09-12 MED ORDER — KETOROLAC TROMETHAMINE 30 MG/ML IJ SOLN
INTRAMUSCULAR | Status: AC
  Administered 2012-09-12: 30 mg
  Filled 2012-09-12: qty 1

## 2012-09-12 MED ORDER — PROPOFOL 10 MG/ML IV BOLUS
INTRAVENOUS | Status: DC | PRN
  Administered 2012-09-12: 10 mg via INTRAVENOUS
  Administered 2012-09-12: 200 mg via INTRAVENOUS

## 2012-09-12 MED ORDER — LIDOCAINE HCL (CARDIAC) 20 MG/ML IV SOLN
INTRAVENOUS | Status: DC | PRN
  Administered 2012-09-12: 60 mg via INTRAVENOUS

## 2012-09-12 MED ORDER — HYDROMORPHONE HCL PF 1 MG/ML IJ SOLN
0.2500 mg | INTRAMUSCULAR | Status: DC | PRN
  Administered 2012-09-12 (×2): 0.5 mg via INTRAVENOUS

## 2012-09-12 MED ORDER — OXYCODONE-ACETAMINOPHEN 5-325 MG PO TABS: 2.0000 | ORAL_TABLET | ORAL | Status: DC | PRN

## 2012-09-12 MED ORDER — MENTHOL 3 MG MT LOZG
1.0000 | LOZENGE | OROMUCOSAL | Status: DC | PRN
  Filled 2012-09-12: qty 9

## 2012-09-12 MED ORDER — ZOLPIDEM TARTRATE 5 MG PO TABS: 5.0000 mg | ORAL_TABLET | Freq: Every evening | ORAL | Status: DC | PRN

## 2012-09-12 MED ORDER — FENTANYL CITRATE 0.05 MG/ML IJ SOLN
INTRAMUSCULAR | Status: DC | PRN
  Administered 2012-09-12: 25 ug via INTRAVENOUS
  Administered 2012-09-12 (×3): 50 ug via INTRAVENOUS
  Administered 2012-09-12: 150 ug via INTRAVENOUS
  Administered 2012-09-12: 50 ug via INTRAVENOUS
  Administered 2012-09-12: 25 ug via INTRAVENOUS

## 2012-09-12 MED ORDER — DOCUSATE SODIUM 100 MG PO CAPS
100.0000 mg | ORAL_CAPSULE | Freq: Two times a day (BID) | ORAL | Status: DC
  Administered 2012-09-12 – 2012-09-13 (×2): 100 mg via ORAL
  Filled 2012-09-12 (×2): qty 1

## 2012-09-12 MED ORDER — SODIUM CHLORIDE 0.9 % IV SOLN: 250.0000 mL | INTRAVENOUS | Status: DC

## 2012-09-12 MED ORDER — MORPHINE SULFATE 2 MG/ML IJ SOLN
1.0000 mg | INTRAMUSCULAR | Status: DC | PRN
  Filled 2012-09-12: qty 1

## 2012-09-12 MED ORDER — PHENYLEPHRINE HCL 10 MG/ML IJ SOLN
INTRAMUSCULAR | Status: DC | PRN
  Administered 2012-09-12 (×6): 40 ug via INTRAVENOUS

## 2012-09-12 MED ORDER — HYDROCODONE-ACETAMINOPHEN 5-325 MG PO TABS
1.0000 | ORAL_TABLET | ORAL | Status: DC | PRN
  Administered 2012-09-12 – 2012-09-13 (×4): 2 via ORAL
  Filled 2012-09-12 (×4): qty 2

## 2012-09-12 MED ORDER — SODIUM CHLORIDE 0.9 % IV SOLN
250.0000 mL | INTRAVENOUS | Status: DC
  Administered 2012-09-12: 250 mL via INTRAVENOUS

## 2012-09-12 MED ORDER — BUPIVACAINE-EPINEPHRINE PF 0.25-1:200000 % IJ SOLN
INTRAMUSCULAR | Status: AC
  Filled 2012-09-12: qty 30

## 2012-09-12 MED ORDER — BISACODYL 10 MG RE SUPP: 10.0000 mg | Freq: Every day | RECTAL | Status: DC | PRN

## 2012-09-12 MED ORDER — PANTOPRAZOLE SODIUM 40 MG IV SOLR
40.0000 mg | Freq: Every day | INTRAVENOUS | Status: DC
  Administered 2012-09-12: 40 mg via INTRAVENOUS
  Filled 2012-09-12 (×3): qty 40

## 2012-09-12 MED ORDER — ACETAMINOPHEN 325 MG PO TABS: 650.0000 mg | ORAL_TABLET | ORAL | Status: DC | PRN

## 2012-09-12 MED ORDER — BUPIVACAINE-EPINEPHRINE 0.25% -1:200000 IJ SOLN
INTRAMUSCULAR | Status: DC | PRN
  Administered 2012-09-12: 6 mL

## 2012-09-12 MED ORDER — HYDROMORPHONE HCL PF 1 MG/ML IJ SOLN
INTRAMUSCULAR | Status: AC
  Filled 2012-09-12: qty 1

## 2012-09-12 MED ORDER — GLYCOPYRROLATE 0.2 MG/ML IJ SOLN
INTRAMUSCULAR | Status: DC | PRN
  Administered 2012-09-12: 0.4 mg via INTRAVENOUS

## 2012-09-12 MED ORDER — ONDANSETRON HCL 4 MG/2ML IJ SOLN
INTRAMUSCULAR | Status: DC | PRN
  Administered 2012-09-12: 4 mg via INTRAVENOUS

## 2012-09-12 MED ORDER — DEXAMETHASONE SODIUM PHOSPHATE 4 MG/ML IJ SOLN
INTRAMUSCULAR | Status: DC | PRN
  Administered 2012-09-12: 8 mg via INTRAVENOUS

## 2012-09-12 MED ORDER — ACETAMINOPHEN 650 MG RE SUPP
650.0000 mg | RECTAL | Status: DC | PRN
Start: 2012-09-12 — End: 2012-09-12
  Filled 2012-09-12: qty 1

## 2012-09-12 MED ORDER — METHOCARBAMOL 500 MG PO TABS: 500.0000 mg | ORAL_TABLET | Freq: Four times a day (QID) | ORAL | Status: DC

## 2012-09-12 MED ORDER — SODIUM CHLORIDE 0.9 % IJ SOLN: 3.0000 mL | INTRAMUSCULAR | Status: DC | PRN

## 2012-09-12 MED ORDER — ONDANSETRON HCL 4 MG/2ML IJ SOLN: 4.0000 mg | Freq: Once | INTRAMUSCULAR | Status: DC | PRN

## 2012-09-12 MED ORDER — SODIUM CHLORIDE 0.9 % IJ SOLN: 3.0000 mL | Freq: Two times a day (BID) | INTRAMUSCULAR | Status: DC

## 2012-09-12 MED ORDER — PHENOL 1.4 % MT LIQD: 1.0000 | OROMUCOSAL | Status: DC | PRN

## 2012-09-12 MED ORDER — ROCURONIUM BROMIDE 100 MG/10ML IV SOLN
INTRAVENOUS | Status: DC | PRN
  Administered 2012-09-12: 40 mg via INTRAVENOUS
  Administered 2012-09-12 (×2): 10 mg via INTRAVENOUS

## 2012-09-12 MED ORDER — METHOCARBAMOL 500 MG PO TABS
500.0000 mg | ORAL_TABLET | Freq: Four times a day (QID) | ORAL | Status: DC | PRN
  Administered 2012-09-12 – 2012-09-13 (×3): 500 mg via ORAL
  Filled 2012-09-12 (×4): qty 1

## 2012-09-12 SURGICAL SUPPLY — 56 items
24mm skyline plate ×2 IMPLANT
BENZOIN TINCTURE PRP APPL 2/3 (GAUZE/BANDAGES/DRESSINGS) ×4 IMPLANT
BIT DRILL SKYLINE 12MM (BIT) ×1 IMPLANT
BLADE SURG 15 STRL LF DISP TIS (BLADE) ×1 IMPLANT
BLADE SURG 15 STRL SS (BLADE) ×1
BLADE SURG ROTATE 9660 (MISCELLANEOUS) IMPLANT
BONE CERV LORDOTIC 14.5X12X6 (Bone Implant) ×4 IMPLANT
BUR ROUND FLUTED 4 SOFT TCH (BURR) ×2 IMPLANT
CLOTH BEACON ORANGE TIMEOUT ST (SAFETY) ×2 IMPLANT
COLLAR CERV LO CONTOUR FIRM DE (SOFTGOODS) ×2 IMPLANT
CORDS BIPOLAR (ELECTRODE) ×2 IMPLANT
COVER SURGICAL LIGHT HANDLE (MISCELLANEOUS) ×2 IMPLANT
DRAPE C-ARM 42X72 X-RAY (DRAPES) ×2 IMPLANT
DRAPE INCISE IOBAN 66X45 STRL (DRAPES) ×2 IMPLANT
DRAPE MICROSCOPE LEICA (MISCELLANEOUS) ×2 IMPLANT
DRAPE PROXIMA HALF (DRAPES) ×2 IMPLANT
DRILL BIT SKYLINE 12MM (BIT) ×1
DURAPREP 6ML APPLICATOR 50/CS (WOUND CARE) ×2 IMPLANT
ELECT COATED BLADE 2.86 ST (ELECTRODE) ×2 IMPLANT
ELECT REM PT RETURN 9FT ADLT (ELECTROSURGICAL) ×2
ELECTRODE REM PT RTRN 9FT ADLT (ELECTROSURGICAL) ×1 IMPLANT
EVACUATOR 1/8 PVC DRAIN (DRAIN) ×2 IMPLANT
GAUZE XEROFORM 1X8 LF (GAUZE/BANDAGES/DRESSINGS) ×2 IMPLANT
GLOVE BIOGEL PI IND STRL 7.5 (GLOVE) ×1 IMPLANT
GLOVE BIOGEL PI IND STRL 8 (GLOVE) ×1 IMPLANT
GLOVE BIOGEL PI INDICATOR 7.5 (GLOVE) ×1
GLOVE BIOGEL PI INDICATOR 8 (GLOVE) ×1
GLOVE ECLIPSE 7.0 STRL STRAW (GLOVE) ×2 IMPLANT
GLOVE ORTHO TXT STRL SZ7.5 (GLOVE) ×4 IMPLANT
GOWN PREVENTION PLUS LG XLONG (DISPOSABLE) ×2 IMPLANT
GOWN PREVENTION PLUS XLARGE (GOWN DISPOSABLE) ×2 IMPLANT
GOWN STRL NON-REIN LRG LVL3 (GOWN DISPOSABLE) ×4 IMPLANT
HEAD HALTER (SOFTGOODS) ×2 IMPLANT
HEMOSTAT SURGICEL 2X14 (HEMOSTASIS) IMPLANT
KIT BASIN OR (CUSTOM PROCEDURE TRAY) ×2 IMPLANT
KIT ROOM TURNOVER OR (KITS) ×2 IMPLANT
MANIFOLD NEPTUNE II (INSTRUMENTS) ×2 IMPLANT
NEEDLE 25GX 5/8IN NON SAFETY (NEEDLE) ×2 IMPLANT
NS IRRIG 1000ML POUR BTL (IV SOLUTION) ×2 IMPLANT
PACK ORTHO CERVICAL (CUSTOM PROCEDURE TRAY) ×2 IMPLANT
PAD ARMBOARD 7.5X6 YLW CONV (MISCELLANEOUS) ×4 IMPLANT
PATTIES SURGICAL .5 X.5 (GAUZE/BANDAGES/DRESSINGS) ×2 IMPLANT
PIN TEMP SKYLINE THREADED (PIN) ×2 IMPLANT
SCREW VARIABLE SELF TAP 12MM (Screw) ×8 IMPLANT
SPONGE GAUZE 4X4 12PLY (GAUZE/BANDAGES/DRESSINGS) ×2 IMPLANT
STRIP CLOSURE SKIN 1/2X4 (GAUZE/BANDAGES/DRESSINGS) ×2 IMPLANT
SURGIFLO TRUKIT (HEMOSTASIS) IMPLANT
SUT SILK 2 0 (SUTURE) ×1
SUT SILK 2-0 18XBRD TIE 12 (SUTURE) ×1 IMPLANT
SUT VIC AB 3-0 X1 27 (SUTURE) ×2 IMPLANT
SUT VICRYL 4-0 PS2 18IN ABS (SUTURE) ×4 IMPLANT
SYR 30ML SLIP (SYRINGE) ×2 IMPLANT
SYR CONTROL 10ML LL (SYRINGE) ×2 IMPLANT
TOWEL OR 17X24 6PK STRL BLUE (TOWEL DISPOSABLE) ×2 IMPLANT
TOWEL OR 17X26 10 PK STRL BLUE (TOWEL DISPOSABLE) ×2 IMPLANT
WATER STERILE IRR 1000ML POUR (IV SOLUTION) ×2 IMPLANT

## 2012-09-12 NOTE — Transfer of Care (Signed)
Immediate Anesthesia Transfer of Care Note  Patient: Carla [name]  Procedure(s) Performed: Procedure(s) (LRB) with comments: ANTERIOR CERVICAL DECOMPRESSION/DISCECTOMY FUSION 2 LEVELS (N/A) - C4-5, C5-6 Anterior Cervical Discectomy and Fusion, Bone Allograft, Plate  Patient Location: PACU  Anesthesia Type:General  Level of Consciousness: awake, alert  and oriented  Airway & Oxygen Therapy: Patient Spontanous Breathing  Post-op Assessment: Report given to PACU RN  Post vital signs: stable  Complications: No apparent anesthesia complications

## 2012-09-12 NOTE — Anesthesia Preprocedure Evaluation (Addendum)
Anesthesia Evaluation  Patient identified by MRN, date of birth, ID band Patient awake    Reviewed: Allergy & Precautions, H&P , NPO status , Patient's Chart, lab work & pertinent test results, reviewed documented beta blocker date and time   Airway Mallampati: I TM Distance: >3 FB Neck ROM: full    Dental  (+) Teeth Intact and Dental Advisory Given   Pulmonary  breath sounds clear to auscultation        Cardiovascular Rhythm:regular Rate:Normal     Neuro/Psych  Headaches,  Neuromuscular disease    GI/Hepatic   Endo/Other    Renal/GU      Musculoskeletal   Abdominal (+)  Abdomen: soft. Bowel sounds: normal.  Peds  Hematology  (+) Blood dyscrasia, ,   Anesthesia Other Findings   Reproductive/Obstetrics                        Anesthesia Physical Anesthesia Plan  ASA: I  Anesthesia Plan: General   Post-op Pain Management:    Induction: Intravenous  Airway Management Planned:   Additional Equipment:   Intra-op Plan:   Post-operative Plan: Extubation in OR  Informed Consent: I have reviewed the patients History and Physical, chart, labs and discussed the procedure including the risks, benefits and alternatives for the proposed anesthesia with the patient or authorized representative who has indicated his/her understanding and acceptance.     Plan Discussed with: CRNA, Anesthesiologist and Surgeon  Anesthesia Plan Comments:         Anesthesia Quick Evaluation

## 2012-09-12 NOTE — Anesthesia Procedure Notes (Signed)
Procedure Name: Intubation Date/Time: 09/12/2012 1:20 PM Performed by: Ellin Goodie Pre-anesthesia Checklist: Patient identified, Emergency Drugs available, Suction available, Patient being monitored and Timeout performed Patient Re-evaluated:Patient Re-evaluated prior to inductionOxygen Delivery Method: Circle system utilized Preoxygenation: Pre-oxygenation with 100% oxygen Intubation Type: IV induction Ventilation: Mask ventilation without difficulty Laryngoscope Size: Mac and 3 Grade View: Grade I Tube type: Oral Number of attempts: 1 Airway Equipment and Method: Stylet Placement Confirmation: ETT inserted through vocal cords under direct vision,  positive ETCO2 and breath sounds checked- equal and bilateral Secured at: 22 cm Tube secured with: Tape Dental Injury: Teeth and Oropharynx as per pre-operative assessment

## 2012-09-12 NOTE — Anesthesia Postprocedure Evaluation (Signed)
Anesthesia Post Note  Patient: Carla [name]  Procedure(s) Performed: Procedure(s) (LRB): ANTERIOR CERVICAL DECOMPRESSION/DISCECTOMY FUSION 2 LEVELS (N/A)  Anesthesia type: general  Patient location: PACU  Post pain: Pain level controlled  Post assessment: Patient's Cardiovascular Status Stable  Last Vitals:  Filed Vitals:   09/12/12 1715  BP:   Pulse:   Temp: 36.9 C  Resp:     Post vital signs: Reviewed and stable  Level of consciousness: sedated  Complications: No apparent anesthesia complications

## 2012-09-12 NOTE — Brief Op Note (Cosign Needed)
09/12/2012  3:29 PM  PATIENT:  Carla [name]  41 y.o. female  PRE-OPERATIVE DIAGNOSIS:  C4-5, C5-6 Spondylosis, HNP  POST-OPERATIVE DIAGNOSIS:  C4-5, C5-6 Spondylosis, HNP  PROCEDURE:  Procedure(s) (LRB) with comments: ANTERIOR CERVICAL DECOMPRESSION/DISCECTOMY FUSION 2 LEVELS (N/A) - C4-5, C5-6 Anterior Cervical Discectomy and Fusion, Bone Allograft, Plate  SURGEON:  Surgeon(s) and Role:    * Eldred Manges, MD - Primary  PHYSICIAN ASSISTANT: Maud Deed PAC  ASSISTANTS: none   ANESTHESIA:   general  EBL:  Total I/O In: 1500 [I.V.:1500] Out: 200 [Blood:200]  BLOOD ADMINISTERED:none  DRAINS: (1) Hemovact drain(s) in the anterior neck with  Suction Open   LOCAL MEDICATIONS USED:  MARCAINE     SPECIMEN:  No Specimen  DISPOSITION OF SPECIMEN:  N/A  COUNTS:  YES  TOURNIQUET:  * No tourniquets in log *  DICTATION: .Note written in EPIC  PLAN OF CARE: Admit to inpatient   PATIENT DISPOSITION:  PACU - hemodynamically stable.   Delay start of Pharmacological VTE agent (>24hrs) due to surgical blood loss or risk of bleeding: yes

## 2012-09-12 NOTE — H&P (Signed)
  [facility]   A Division of [employer]   [address], [place], [place] [address] Telephone: [phone]  Fax: [phone]     PATIENT: [name], Carla   MR#: [id number]  DOB: Apr 01, 1971       CHIEF COMPLAINT:   Chronic neck, shoulder and arm pain.     The patient returns with ongoing problems with cervical spine pain.  Her pain is moderate in severity, relatively constant, worse with rotation.  She has increased pain with forward flexion.  She has looked at ergonomic changes, taking anti-inflammatories without relief.  She has no known allergies.     Previous surgeries including right clavicle surgery.  Family and social history is updated.  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   REVIEW OF SYSTEMS:   Positive for migraines.  Previous clavicle surgery, chronic cervical pain.   PHYSICAL EXAMINATION:  The patient is alert and oriented, WD, WN.  Extraocular movements intact.  Negative impingement of the shoulder.  Negative drop arm test.  Lower extremity reflexes show no hyperreflexia.     RADIOGRAPHS/TEST:   The patient's MRI scan is reviewed with her on the PACS system.  This shows moderate size right foraminal osteophyte disk complex with right foraminal narrowing and encroachment of the right C5 nerve root.  There is some caudal extension inferiorly behind the C5 body.  The C5-6 level shows disk osteophyte complex with narrowing and central stenosis down to 8 mm.  Moderate foraminal narrowing worse on the left than right at this level.  Incidental shallow paracentral protrusion at C7-T1 without compression.   PLAN:  We had a long discussion concerning the scan, her ongoing symptoms, continued pain, not responsive to anti-inflammatories, traction, physical therapy, activity modification, symptoms greater than a year.  Treatment options were discussed including 2-level cervical fusion at C4-5 to C5-6 with allograft and  plate.  We discussed operative technique.  Post operative collar immobilization.  We will place her in a soft collar that she can use intermittently.     Risks of surgery discussed including bleeding, infection, recurrent laryngeal nerve injury, hoarseness, dural tear, paralysis, carotid artery or jugular vein injury, infection, reoperation and need for posterior cervical fusion if it does not heal and is symptomatic.  All questions answered.           Jearl Soto C. Ophelia Charter, M.D.    Auto-Authenticated by Veverly Fells. Ophelia Charter, M.D.

## 2012-09-12 NOTE — Interval H&P Note (Signed)
History and Physical Interval Note:  09/12/2012 1:27 PM  Carla [name]  has presented today for surgery, with the diagnosis of C4-5, C5-6 Spondylosis, HNP  The various methods of treatment have been discussed with the patient and family. After consideration of risks, benefits and other options for treatment, the patient has consented to  Procedure(s) (LRB) with comments: ANTERIOR CERVICAL DECOMPRESSION/DISCECTOMY FUSION 2 LEVELS (N/A) - C4-5, C5-6 Anterior Cervical Discectomy and Fusion, Bone Allograft, Plate as a surgical intervention .  The patient's history has been reviewed, patient examined, no change in status, stable for surgery.  I have reviewed the patient's chart and labs.  Questions were answered to the patient's satisfaction.     Ermagene Saidi C

## 2012-09-12 NOTE — Preoperative (Signed)
Beta Blockers   Reason not to administer Beta Blockers:Not Applicable 

## 2012-09-13 MED ORDER — PANTOPRAZOLE SODIUM 40 MG PO TBEC
40.0000 mg | DELAYED_RELEASE_TABLET | Freq: Every day | ORAL | Status: DC
  Administered 2012-09-13: 40 mg via ORAL
  Filled 2012-09-13: qty 1

## 2012-09-13 NOTE — Progress Notes (Signed)
UR COMPLETED  

## 2012-09-13 NOTE — Op Note (Signed)
NAMEMarland Kitchen  Carla, [name] NO.:  [id number]  MEDICAL RECORD NO.:  [id number]  LOCATION:  4N13C                        FACILITY:  [facility]  PHYSICIAN:  Carla [name], M.D.    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  PROCEDURE:  C4-5, C5-6 anterior cervical diskectomy and fusion, allograft and plate.  SURGEON:  Rakim Moone C. [name], M.D.  ASSISTANT:  Wende Neighbors, PA-C, medically necessary and present for the entire procedure.  EBL:  Less than 100 mL.  COMPLICATIONS:  None.  DRAINS:  One Hemovac neck.  ANESTHESIA:  GOT plus 6 mL of Marcaine skin local.  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  She has taken anti-inflammatories, been through home traction, had therapy, anti-inflammatories, pain medication.  MRI scan showed disk protrusion, narrowing of the canal with central stenosis down to 8 mm and caudally extension inferiorly behind the C4 body with broad-based disk at C5-6 with compression.  There was some minimal changes at C6-7.  PROCEDURE:  After induction of general anesthesia, Ancef prophylaxis, head halter traction application using horseshoe head holder, neck was prepped with DuraPrep.  No traction was applied.  The area was squared with towels, sterile skin marker, and a prominent skin fold to the appropriate level and Betadine, Steri-Drape.  Thyroid sheet, drapes, sterile Mayo stand at the head was applied.  Incision was made at the midline and extended to the left with sharp dissection through the platysma.  There was a large prominent vein present, which was tied off using 3-0 silk sutures after  mobilization and use of her clamp.  Sutures were tied, vein was cut.  There was good hemostasis.  Blunt dissection down the longus coli muscles were performed and carotid sheath and contents lateral.  Large spur at C5-6 was noted and a needle was placed at C4-5 level confirmed with the lateral C-arm, which confirmed the appropriate level with the 25 short gauge needle and then was pulled out.  Immediately, a box was cut out of the disk at C4-5.  Self- retaining Cloward was replaced with teeth blades right and left, smooth blades up and down cephalad caudad.  Diskectomy was performed.  Bur was used, minimally hand-curetting with endplates.  Disk space was relatively tight.  There was extruded disk fragments, which were teased primarily on the right side causing significant compression with the patient's radicular symptoms and central narrowing.  Continue removal until the dura was visualized.  Trial showed a 6-mm graft gave a nice tight fit.  Cortical cancellous allograft was opened, which was already hydrated.  Incision was irrigated copiously.  Graft was marked at the midline, inserted with the graft, countersunk 2 mm and identical procedures were repeated at the C5-6 level.  At this level, there was large bridging spurs.  There was minimal motion and large uncovertebral spurs that had to be removed with broad-based disk  protrusion.  Removal of all disk material down to the dura with operative microscope.  No fragments were extruded at this level.  Uncovertebral joints required conservative amount of removal of overhanging spurs.  With good decompression, again a 6-mm Sizer was appropriate.  Endplates were rasped.  Graft was countersunk 1 mm and then 24-mm DePuy Skyline plate was selected.  Short 12-mm screws were used for stabilization.  AP and lateral C-arm pictures were taken.  At the C6 vertebral body, some of the spur had to be removed anteriorly in order for the plate to sit  down flat.  This left the screws in C6 close to the graft, but not touching the graft.  Screws in C4 and C5 were in excellent position.  Final spot pictures were taken.  Screws were locked down with tiny screwdriver until it clicked irrigation, Hemovac with in-and-out technique on the left in line with the skin incision, which was started the midline extended to the left.  Platysma was closed with 3-0 Vicryl and 4-0 Vicryl subcuticular closure.  Tincture of benzoin, Steri-Strips, Marcaine infiltration, postop dressing, tape and soft cervical collar. The patient tolerated the procedure well, and was transferred to the recovery room.  Instrument count and needle count were correct. Procedure was as posted and planned.     Carla [name], M.D.     MCY/MEDQ  D:  09/12/2012  T:  09/13/2012  Job:  [id number]

## 2012-09-13 NOTE — Progress Notes (Signed)
Pt stable, gave robaxin prior to discharge, no complaints. education completed. To be discharged home with family.

## 2012-09-13 NOTE — Progress Notes (Signed)
Subjective: 1 Day Post-Op Procedure(s) (LRB): ANTERIOR CERVICAL DECOMPRESSION/DISCECTOMY FUSION 2 LEVELS (N/A) Patient reports pain as mild.   No sore throat and eating well.  Ambulating well.  No arm pain. Objective: Vital signs in last 24 hours: Temp:  [97.8 F (36.6 C)-98.5 F (36.9 C)] 98.2 F (36.8 C) (12/31 0515) Pulse Rate:  [83-100] 85  (12/31 0515) Resp:  [14-20] 20  (12/31 0515) BP: (102-123)/(54-65) 114/59 mmHg (12/31 0515) SpO2:  [98 %-100 %] 100 % (12/31 0515) Weight:  [60.782 kg (134 lb)] 60.782 kg (134 lb) (12/30 2103)  Intake/Output from previous day: 12/30 0701 - 12/31 0700 In: 1500 [I.V.:1500] Out: 220 [Drains:20; Blood:200] Intake/Output this shift:    No results found for this basename: HGB:5 in the last 72 hours No results found for this basename: WBC:2,RBC:2,HCT:2,PLT:2 in the last 72 hours No results found for this basename: NA:2,K:2,CL:2,CO2:2,BUN:2,CREATININE:2,GLUCOSE:2,CALCIUM:2 in the last 72 hours No results found for this basename: LABPT:2,INR:2 in the last 72 hours  Neurovascular intact hemovac removed.  wound flat and dry  Assessment/Plan: 1 Day Post-Op Procedure(s) (LRB): ANTERIOR CERVICAL DECOMPRESSION/DISCECTOMY FUSION 2 LEVELS (N/A) Advance diet Discharge home. Ov 2 weeks rx percocet and robaxin Collar at all times Dressing change before dc and prn at home.    Dymond Spreen M 09/13/2012, 11:06 AM

## 2012-09-15 ENCOUNTER — Encounter (HOSPITAL_COMMUNITY): Payer: Self-pay | Admitting: Orthopaedic Surgery

## 2012-09-28 NOTE — Discharge Summary (Signed)
Physician Discharge Summary  Patient ID: Carla [name] MRN: [id number] DOB/AGE: 1971/02/20 42 y.o.  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 Headache     "migraines"  . Neuromuscular disorder     carpal tunnel right side  . Chronic neck pain     Surgeries: Procedure(s): ANTERIOR CERVICAL DECOMPRESSION/DISCECTOMY FUSION C4-5 and C5-6, 2 LEVELS on 09/12/2012   Consultants (if any):  none  Discharged Condition: Improved  Hospital Course: Carla [name] is an 41 y.o. female who was admitted 09/12/2012 with a diagnosis of Cervical spondylosis with radiculopathy and went to the operating room on 09/12/2012 and underwent the above named procedures.    She was given perioperative antibiotics:  Anti-infectives     Start     Dose/Rate Route Frequency Ordered Stop   09/12/12 2000   ceFAZolin (ANCEF) IVPB 1 g/50 mL premix  Status:  Discontinued        1 g 100 mL/hr over 30 Minutes Intravenous Every 8 hours 09/12/12 1629 09/12/12 1812   09/12/12 2000   ceFAZolin (ANCEF) IVPB 1 g/50 mL premix        1 g 100 mL/hr over 30 Minutes Intravenous Every 8 hours 09/12/12 1818 09/13/12 0422   09/11/12 1343   ceFAZolin (ANCEF) IVPB 2 g/50 mL premix        2 g 100 mL/hr over 30 Minutes Intravenous 60 min pre-op 09/11/12 1343 09/12/12 1320        .  She was given sequential compression devices, early ambulation for DVT prophylaxis.  She benefited maximally from the hospital stay and there were no complications.    Recent vital signs:  Filed Vitals:   09/13/12 0515  BP: 114/59  Pulse: 85  Temp: 98.2 F (36.8 C)  Resp: 20    Recent laboratory studies:  Lab Results  Component Value Date   HGB 10.8* 09/05/2012   HGB 11.3* 12/20/2009   HGB 11.4* 12/30/2007    Lab Results  Component Value Date   WBC 9.0 09/05/2012   PLT 276 09/05/2012   Lab Results  Component Value Date   INR 1.00 09/05/2012   Lab Results  Component Value Date   NA 136 09/05/2012   K 4.3 09/05/2012   CL 100 09/05/2012   CO2 25 09/05/2012   BUN 12 09/05/2012   CREATININE 0.88 09/05/2012   GLUCOSE 86 09/05/2012    Discharge Medications:     Medication List     As of 09/28/2012  1:04 PM    STOP taking these medications         ibuprofen 200 MG tablet   Commonly known as: ADVIL,MOTRIN      TAKE these medications         methocarbamol 500 MG tablet   Commonly known as: ROBAXIN   Take 1 tablet (500 mg total) by mouth 4 (four) times daily.      oxyCODONE-acetaminophen 5-325 MG per tablet   Commonly known as: PERCOCET/ROXICET   Take 2 tablets by mouth every 4 (four) hours as needed for pain.        Diagnostic Studies: Dg Cervical Spine 2-3 Views  09/12/2012  *RADIOLOGY REPORT*  Clinical Data: ACDF C4-C6  CERVICAL SPINE - 2-3 VIEW,DG C-ARM 61-120 MIN  Comparison: MRI cervical spine 07/28/2012  Findings: Intraoperative  spot radiographs demonstrate interval operative changes of anterior cervical discectomy and fusion with anterior plate and vertebral body screw contracts spanning C4-C6. Interbody spacers are identified at C4-C5 and C5-C6.  No evidence of immediate hardware complication.  There is straightening of the normal cervical lordosis.  The patient is intubated and a nasal or oral esophageal tube is present.  IMPRESSION:  Interval surgical changes of C4-C6 ACDF with interbody spacers at C4-C5 and C5-C6 without evidence of immediate complication.   Original Report Authenticated By: Malachy Moan, M.D.    Dg C-arm 61-120 Min  09/12/2012  *RADIOLOGY REPORT*  Clinical Data: ACDF C4-C6  CERVICAL SPINE - 2-3 VIEW,DG C-ARM 61-120 MIN  Comparison: MRI cervical spine 07/28/2012  Findings: Intraoperative spot radiographs demonstrate interval operative changes of  anterior cervical discectomy and fusion with anterior plate and vertebral body screw contracts spanning C4-C6. Interbody spacers are identified at C4-C5 and C5-C6.  No evidence of immediate hardware complication.  There is straightening of the normal cervical lordosis.  The patient is intubated and a nasal or oral esophageal tube is present.  IMPRESSION:  Interval surgical changes of C4-C6 ACDF with interbody spacers at C4-C5 and C5-C6 without evidence of immediate complication.   Original Report Authenticated By: Malachy Moan, M.D.     Disposition: 01-Home or Self Care      Discharge Orders    Future Orders Please Complete By Expires   Diet - low sodium heart healthy      Call MD / Call 911      Comments:   If you experience chest pain or shortness of breath, CALL 911 and be transported to the hospital emergency room.  If you develope a fever above 101 F, pus (white drainage) or increased drainage or redness at the wound, or calf pain, call your surgeon's office.   Constipation Prevention      Comments:   Drink plenty of fluids.  Prune juice may be helpful.  You may use a stool softener, such as Colace (over the counter) 100 mg twice a day.  Use MiraLax (over the counter) for constipation as needed.   Increase activity slowly as tolerated      Driving restrictions      Comments:   No driving   Lifting restrictions      Comments:   No lifting    No lifting greater than 10 lbs. No overhead use of arms. Avoid bending,and twisting neck. Walk in house for first week them may start to get out slowly increasing distance up to one mile by 3 weeks post op. Keep incision dry for 3 days, may then bathe and wet incision using a [place] collar when showering. Call if any fevers >101, chills, or increasing numbness or weakness or increased swelling or drainage.   Follow-up Information    Follow up with Eldred Manges, MD. Schedule an appointment as soon as possible for a visit in 2 weeks.    Contact information:   [address] [place] [address] [phone]           Signed: Wende Neighbors 09/28/2012, 1:04 PM

## 2015-06-24 ENCOUNTER — Other Ambulatory Visit: Payer: Self-pay | Admitting: Orthopedic Surgery

## 2015-06-24 DIAGNOSIS — M542 Cervicalgia: Secondary | ICD-10-CM

## 2015-06-25 ENCOUNTER — Other Ambulatory Visit: Payer: Managed Care, Other (non HMO)

## 2015-07-15 ENCOUNTER — Ambulatory Visit
Admission: RE | Admit: 2015-07-15 | Discharge: 2015-07-15 | Disposition: A | Discharge status: Home / Self Care | Payer: Managed Care, Other (non HMO) | Source: Ambulatory Visit | Attending: Orthopedic Surgery | Admitting: Orthopedic Surgery

## 2015-07-15 DIAGNOSIS — M542 Cervicalgia: Secondary | ICD-10-CM

## 2015-07-26 ENCOUNTER — Other Ambulatory Visit: Payer: Self-pay

## 2015-07-26 ENCOUNTER — Other Ambulatory Visit: Payer: Self-pay | Admitting: Orthopedic Surgery

## 2015-07-26 ENCOUNTER — Other Ambulatory Visit: Payer: Managed Care, Other (non HMO)

## 2015-07-26 DIAGNOSIS — M25511 Pain in right shoulder: Secondary | ICD-10-CM

## 2015-08-02 ENCOUNTER — Ambulatory Visit
Admission: RE | Admit: 2015-08-02 | Discharge: 2015-08-02 | Disposition: A | Discharge status: Home / Self Care | Payer: Managed Care, Other (non HMO) | Source: Ambulatory Visit | Attending: Orthopedic Surgery | Admitting: Orthopedic Surgery

## 2015-08-02 DIAGNOSIS — M25511 Pain in right shoulder: Secondary | ICD-10-CM

## 2015-08-07 ENCOUNTER — Other Ambulatory Visit: Payer: Managed Care, Other (non HMO)

## 2015-08-12 ENCOUNTER — Other Ambulatory Visit: Payer: Managed Care, Other (non HMO)

## 2018-03-30 ENCOUNTER — Telehealth (INDEPENDENT_AMBULATORY_CARE_PROVIDER_SITE_OTHER): Payer: Self-pay | Admitting: *Deleted

## 2018-03-30 NOTE — Telephone Encounter (Signed)
Okay to schedule it is a repeat but I would include a 30-minute appointment and tell her we would basically evaluate at the same time and depending on what we see we may or may not do the injection.

## 2018-03-31 NOTE — Telephone Encounter (Signed)
Submitted Clinical Notes to [employer] website, case is pending.

## 2018-04-06 NOTE — Telephone Encounter (Signed)
Pt scheduled for OV on 04/28/18.

## 2018-04-27 ENCOUNTER — Ambulatory Visit (INDEPENDENT_AMBULATORY_CARE_PROVIDER_SITE_OTHER): Payer: 59 | Admitting: Physical Medicine and Rehabilitation

## 2018-04-27 ENCOUNTER — Telehealth (INDEPENDENT_AMBULATORY_CARE_PROVIDER_SITE_OTHER): Payer: Self-pay | Admitting: *Deleted

## 2018-04-27 VITALS — BP 106/66 | HR 85

## 2018-04-27 DIAGNOSIS — M5412 Radiculopathy, cervical region: Secondary | ICD-10-CM

## 2018-04-27 DIAGNOSIS — M542 Cervicalgia: Secondary | ICD-10-CM

## 2018-04-27 DIAGNOSIS — M961 Postlaminectomy syndrome, not elsewhere classified: Secondary | ICD-10-CM

## 2018-04-27 DIAGNOSIS — M4802 Spinal stenosis, cervical region: Secondary | ICD-10-CM | POA: Diagnosis not present

## 2018-04-27 DIAGNOSIS — R202 Paresthesia of skin: Secondary | ICD-10-CM

## 2018-04-27 MED ORDER — TIZANIDINE HCL 4 MG PO TABS: 2.0000 mg | ORAL_TABLET | Freq: Every day | ORAL | 0 refills | Status: AC

## 2018-04-27 NOTE — Telephone Encounter (Signed)
Authorization Number:  [id number]  Auth Effective Date:  04/27/2018  Auth End Date:  07/26/2018   Called pt and left vm #1.

## 2018-04-27 NOTE — Progress Notes (Signed)
 .  Numeric Pain Rating Scale and Functional Assessment Average Pain 7 Pain Right Now 0 My pain is intermittent, tingling and aching Pain is worse with: some activites Pain improves with: therapy/exercise   In the last MONTH (on 0-10 scale) has pain interfered with the following?  1. General activity like being  able to carry out your everyday physical activities such as walking, climbing stairs, carrying groceries, or moving a chair?  Rating(3)  2. Relation with others like being able to carry out your usual social activities and roles such as  activities at home, at work and in your community. Rating(0)  3. Enjoyment of life such that you have  been bothered by emotional problems such as feeling anxious, depressed or irritable?  Rating(0)

## 2018-04-28 ENCOUNTER — Encounter (INDEPENDENT_AMBULATORY_CARE_PROVIDER_SITE_OTHER): Payer: Self-pay | Admitting: Physical Medicine and Rehabilitation

## 2018-04-28 ENCOUNTER — Ambulatory Visit (INDEPENDENT_AMBULATORY_CARE_PROVIDER_SITE_OTHER): Payer: Self-pay | Admitting: Physical Medicine and Rehabilitation

## 2018-04-28 NOTE — Progress Notes (Signed)
Carla [name] - 47 y.o. female MRN [id number]  Date of birth: 31-Oct-1970  Office Visit Note: Visit Date: 04/27/2018 PCP: [name], MD Referred by: Kerri Perches, MD  Subjective: Chief Complaint  Patient presents with  . Neck - Pain  . Right Arm - Pain, Numbness  . Left Arm - Pain, Numbness   HPI: Ms. Carla [name] is a very pleasant 47 year old right-hand-dominant female that I last saw in 2017 for chronic but worsening neck pain with numbness and tingling in both shoulders and arms.  We completed epidural injection in 2016 and she almost got 100% relief really over a couple years.  Her case is complicated by prior ACDF at C4-5 and C5-6.  She has been followed in the office by Dr. Annell Greening for her cervical spine as well as Dr. August Saucer for her shoulders and orthopedic care.  She really has not had any issues up until the last few months and she has had worsening neck pain with numbness and tingling in both the right and left arm.  This is somewhat dorsal lateral symptoms into the middle of the hands.  More of the C7 distribution.  She reports no specific injury or activity that seem to get worse.  She does report being under a lot of stress continually and she has a new child that she is taking care of believe is a foster child.  She does get some pain at night and it is waking her up from sleeping.  She reports worsening with laying on certain sides and she will get numbness and tingling.  She has not had prior electrodiagnostic studies.  She has not been diagnosed with carpal tunnel syndrome.  She reports her pain level is a 7 out of 10 on average.  She has been using ibuprofen 800 mg as needed up to a couple times a day and it does help to a degree.  She reports that exercise and working out seems to help her symptoms.  She has had some massage as well.  She has not had recent physical therapy but has had therapy in the past and conservative care and surgery.  She has had no associated  headaches although she had a bout last year of severe migraine type headaches which were finally diagnosed as a severe sinus infection and those have been much better.   Review of Systems  Constitutional: Negative for chills, fever, malaise/fatigue and weight loss.  HENT: Negative for hearing loss and sinus pain.   Eyes: Negative for blurred vision, double vision and photophobia.  Respiratory: Negative for cough and shortness of breath.   Cardiovascular: Negative for chest pain, palpitations and leg swelling.  Gastrointestinal: Negative for abdominal pain, nausea and vomiting.  Genitourinary: Negative for flank pain.  Musculoskeletal: Positive for joint pain and neck pain. Negative for myalgias.       Bilateral shoulder and arm pain with paresthesia  Skin: Negative for itching and rash.  Neurological: Positive for tingling. Negative for tremors, focal weakness and weakness.  Endo/Heme/Allergies: Negative.   Psychiatric/Behavioral: Negative for depression.  All other systems reviewed and are negative.  Otherwise per HPI.  Assessment & Plan: Visit Diagnoses:  1. Spinal stenosis of cervical region   2. Cervical radiculopathy   3. Post laminectomy syndrome   4. Cervicalgia   5. Paresthesia of skin     Plan: Findings:  Chronic history of neck pain status post ACDF at C4-5 and C5-6 with MRI from 2016 showing adjacent level  disease above and below the fusion.  The level below the fusion appears to be a little bit worse with left-sided ventral contact with the cord without cord signal changes at the time.  She has more of a C7 distribution symptoms in the hands.  Exam is consistent more with cervical radiculitis then carpal tunnel syndrome.  She also has myofascial trigger points particularly in the trapezius and levator scapula bilaterally.  She has no red flag complaints of weakness or unexplained weight loss or any other red flag complaints.  She has had no trauma.  I think the best approach  is to perform a diagnostic and hopefully therapeutic cervical epidural injection.  This will be done with fluoroscopic guidance at C7-T1.  She has failed conservative care including time she is been having increasing pain over the last 8 weeks.  Her symptoms are severe with pain level 7 out of 10.  Prior to this she was almost completely pain-free from her neck and shoulders after injection performed in 2016.  Depending on relief with injection would consider regrouping with physical therapist for myofascial pain and trigger point therapy.  Would consider trigger point therapy in the office.  We also want to prescribe a trial of tizanidine to take at night and see if this will help her sleep.  We did talk about mindfulness meditation and relaxation.  She is under a lot of stress.    Meds & Orders:  Meds ordered this encounter  Medications  . tiZANidine (ZANAFLEX) 4 MG tablet    Sig: Take 0.5-1 tablets (2-4 mg total) by mouth at bedtime.    Dispense:  60 tablet    Refill:  0   No orders of the defined types were placed in this encounter.   Follow-up: Return for C7-T1 interlaminar epidural steroid injection after preauthorization.   Procedures: No procedures performed  No notes on file   Clinical History: MRI CERVICAL SPINE WITHOUT CONTRAST  TECHNIQUE: Multiplanar, multisequence MR imaging of the cervical spine was performed. No intravenous contrast was administered.  COMPARISON:  MRI dated 07/28/2012  FINDINGS: The visualized intracranial contents and paraspinal soft tissues are normal. There is no mass lesion or myelopathy of the cervical spinal cord.  Craniocervical junction through C2-3: Slight left facet arthritis at C2-3. Otherwise normal.  C3-4: Small central subligamentous disc protrusion which slightly indents the ventral aspect of the spinal cord without myelopathy. Widely patent neural foramina.  C4-5 and C5-6: Solid anterior cervical fusion. Widely patent  neural foramina.  C6-7: Disc space narrowing with a broad-based disc osteophyte complex most prominent centrally slightly compressing the ventral aspect of the spinal cord slightly asymmetric to the left without myelopathy. Slight narrowing of the left neural foramen. Right neural foramen appears widely patent. Facet joints are normal.  C7-T1: Tiny broad-based disc bulge with no neural impingement. No foraminal stenosis.  T1-2 through T3-4:  Normal.  IMPRESSION: 1. Slight degenerative disc disease at C3-4 and C6-7 with slight indentation upon the ventral aspect of the spinal cord at both levels without myelopathy or focal neural impingement. 2. Slight left foraminal stenosis at C6-7.   Electronically Signed   By: Francene BoyersJames  Maxwell M.D.   On: 07/15/2015 16:42   She reports that she has never smoked. She has never used smokeless tobacco. No results for input(s): HGBA1C, LABURIC in the last 8760 hours.  Objective:  VS:  HT:    WT:   BMI:     BP:106/66  HR:85bpm  TEMP: ( )  RESP:100 % Physical Exam  Constitutional: She is oriented to person, place, and time. She appears well-developed and well-nourished. No distress.  HENT:  Head: Normocephalic and atraumatic.  Nose: Nose normal.  Mouth/Throat: Oropharynx is clear and moist.  Eyes: Pupils are equal, round, and reactive to light. Conjunctivae are normal.  Neck: Neck supple. No JVD present. No tracheal deviation present.  Cardiovascular: Regular rhythm and intact distal pulses.  Pulmonary/Chest: Effort normal. No respiratory distress.  Abdominal: She exhibits no distension. There is no guarding.  Musculoskeletal:  Patient has some decreased range of motion with rotation and extension of the cervical spine.  She has positive trigger points in the levator scapula and trapezius left more than right.  These do reproduce some of her pain.  She has no shoulder impingement signs.  She has a negative drop arm test.  She does have  some pain over the [facility] joint on the left.  She has good strength in the upper extremities bilaterally.  There is a negative Hoffmann sign bilaterally there is 2+ muscle stretch reflexes at the biceps and brachioradialis.  Lymphadenopathy:    She has no cervical adenopathy.  Neurological: She is alert and oriented to person, place, and time. She exhibits normal muscle tone. Coordination normal.  Skin: Skin is warm. No rash noted. No erythema.  Psychiatric: She has a normal mood and affect. Her behavior is normal.  Nursing note and vitals reviewed.   Ortho Exam Imaging: No results found.  Past Medical/Family/Surgical/Social History: Medications & Allergies reviewed per EMR, new medications updated. Patient Active Problem List   Diagnosis Date Noted  . Cervical spondylosis with radiculopathy 09/12/2012  . HYPERLIPIDEMIA 12/23/2009  . TONSILLITIS, ACUTE 12/20/2009  . 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 ALLERGIC RHINITIS CAUSE UNSPECIFIED 09/28/2008  . OTITIS MEDIA, ACUTE, RIGHT 01/06/2008   Past Medical History:  Diagnosis Date  . Chronic neck pain   . Headache(784.0)    "migraines"  . Neuromuscular disorder (HCC)    carpal tunnel right side   History reviewed. No pertinent family history. Past Surgical History:  Procedure Laterality Date  . ANTERIOR CERVICAL DECOMP/DISCECTOMY FUSION  09/12/2012   Procedure: ANTERIOR CERVICAL DECOMPRESSION/DISCECTOMY FUSION 2 LEVELS;  Surgeon: Eldred MangesMark C Yates, MD;  Location: [facility] OR;  Service: Orthopedics;  Laterality: N/A;  C4-5, C5-6 Anterior Cervical Discectomy and Fusion, Bone Allograft, Plate  . SHOULDER SURGERY     right, 2005   Social History   Occupational History  . Not on file  Tobacco Use  . Smoking status: Never Smoker  . Smokeless tobacco: Never Used  Substance and Sexual Activity  . Alcohol use: Not on file    Comment: "social"  . Drug use: No  . Sexual activity: Not on file

## 2018-05-05 ENCOUNTER — Ambulatory Visit (INDEPENDENT_AMBULATORY_CARE_PROVIDER_SITE_OTHER): Payer: Self-pay

## 2018-05-05 ENCOUNTER — Encounter (INDEPENDENT_AMBULATORY_CARE_PROVIDER_SITE_OTHER): Payer: Self-pay | Admitting: Physical Medicine and Rehabilitation

## 2018-05-05 ENCOUNTER — Ambulatory Visit (INDEPENDENT_AMBULATORY_CARE_PROVIDER_SITE_OTHER): Payer: 59 | Admitting: Physical Medicine and Rehabilitation

## 2018-05-05 VITALS — BP 111/68 | HR 76

## 2018-05-05 DIAGNOSIS — M5412 Radiculopathy, cervical region: Secondary | ICD-10-CM | POA: Diagnosis not present

## 2018-05-05 MED ORDER — METHYLPREDNISOLONE ACETATE 80 MG/ML IJ SUSP
80.0000 mg | Freq: Once | INTRAMUSCULAR | Status: AC
  Administered 2018-05-05: 80 mg

## 2018-05-05 NOTE — Patient Instructions (Signed)

## 2018-05-05 NOTE — Progress Notes (Signed)
 .  Numeric Pain Rating Scale and Functional Assessment Average Pain 7   In the last MONTH (on 0-10 scale) has pain interfered with the following?  1. General activity like being  able to carry out your everyday physical activities such as walking, climbing stairs, carrying groceries, or moving a chair?  Rating(4)   +Driver, -BT, -Dye Allergies.  

## 2018-05-20 NOTE — Progress Notes (Signed)
Carla [name] - 47 y.o. female MRN [id number]  Date of birth: May 06, 1971  Office Visit Note: Visit Date: 05/05/2018 PCP: [name], MD Referred by: [name], MD  Subjective: Chief Complaint  Patient presents with  . Neck - Pain  . Right Arm - Pain  . Left Arm - Pain   HPI: Carla [name] is a 47 year old female who comes in today for planned right C7-T1 interlaminar epidural steroid injection.  Please see our prior evaluation and management note for further details and justification.   ROS Otherwise per HPI.  Assessment & Plan: Visit Diagnoses:  1. Cervical radiculopathy     Plan: No additional findings.   Meds & Orders:  Meds ordered this encounter  Medications  . methylPREDNISolone acetate (DEPO-MEDROL) injection 80 mg    Orders Placed This Encounter  Procedures  . XR C-ARM NO REPORT  . Epidural Steroid injection    Follow-up: Return if symptoms worsen or fail to improve.   Procedures: No procedures performed  Cervical Epidural Steroid Injection - Interlaminar Approach with Fluoroscopic Guidance  Patient: Carla [name]      Date of Birth: 1971-02-05 MRN: [id number] PCP: [name], MD      Visit Date: 05/05/2018   Universal Protocol:    Date/Time: 09/06/196:05 AM  Consent Given By: the patient  Position: PRONE  Additional Comments: Vital signs were monitored before and after the procedure. Patient was prepped and draped in the usual sterile fashion. The correct patient, procedure, and site was verified.   Injection Procedure Details:  Procedure Site One Meds Administered:  Meds ordered this encounter  Medications  . methylPREDNISolone acetate (DEPO-MEDROL) injection 80 mg     Laterality: Right  Location/Site: C7-T1  Needle size: 20 G  Needle type: Touhy  Needle Placement: Paramedian epidural space  Findings:  -Comments: Excellent flow of contrast into the epidural space.  Procedure Details: Using a  paramedian approach from the side mentioned above, the region overlying the inferior lamina was localized under fluoroscopic visualization and the soft tissues overlying this structure were infiltrated with 4 ml. of 1% Lidocaine without Epinephrine. A # 20 gauge, Tuohy needle was inserted into the epidural space using a paramedian approach.  The epidural space was localized using loss of resistance along with lateral and contralateral oblique bi-planar fluoroscopic views.  After negative aspirate for air, blood, and CSF, a 2 ml. volume of Isovue-250 was injected into the epidural space and the flow of contrast was observed. Radiographs were obtained for documentation purposes.   The injectate was administered into the level noted above.  Additional Comments:  The patient tolerated the procedure well Dressing: Band-Aid    Post-procedure details: Patient was observed during the procedure. Post-procedure instructions were reviewed.  Patient left the clinic in stable condition.   Clinical History: MRI CERVICAL SPINE WITHOUT CONTRAST  TECHNIQUE: Multiplanar, multisequence MR imaging of the cervical spine was performed. No intravenous contrast was administered.  COMPARISON:  MRI dated 07/28/2012  FINDINGS: The visualized intracranial contents and paraspinal soft tissues are normal. There is no mass lesion or myelopathy of the cervical spinal cord.  Craniocervical junction through C2-3: Slight left facet arthritis at C2-3. Otherwise normal.  C3-4: Small central subligamentous disc protrusion which slightly indents the ventral aspect of the spinal cord without myelopathy. Widely patent neural foramina.  C4-5 and C5-6: Solid anterior cervical fusion. Widely patent neural foramina.  C6-7: Disc space narrowing with a broad-based disc osteophyte complex most prominent centrally slightly compressing the  ventral aspect of the spinal cord slightly asymmetric to the left  without myelopathy. Slight narrowing of the left neural foramen. Right neural foramen appears widely patent. Facet joints are normal.  C7-T1: Tiny broad-based disc bulge with no neural impingement. No foraminal stenosis.  T1-2 through T3-4:  Normal.  IMPRESSION: 1. Slight degenerative disc disease at C3-4 and C6-7 with slight indentation upon the ventral aspect of the spinal cord at both levels without myelopathy or focal neural impingement. 2. Slight left foraminal stenosis at C6-7.   Electronically Signed   By: Francene Boyers M.D.   On: 07/15/2015 16:42   She reports that she has never smoked. She has never used smokeless tobacco. No results for input(s): HGBA1C, LABURIC in the last 8760 hours.  Objective:  VS:  HT:    WT:   BMI:     BP:111/68  HR:76bpm  TEMP: ( )  RESP:  Physical Exam  Ortho Exam Imaging: No results found.  Past Medical/Family/Surgical/Social History: Medications & Allergies reviewed per EMR, new medications updated. Patient Active Problem List   Diagnosis Date Noted  . Cervical spondylosis with radiculopathy 09/12/2012  . HYPERLIPIDEMIA 12/23/2009  . TONSILLITIS, ACUTE 12/20/2009  . 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 ALLERGIC RHINITIS CAUSE UNSPECIFIED 09/28/2008  . OTITIS MEDIA, ACUTE, RIGHT 01/06/2008   Past Medical History:  Diagnosis Date  . Chronic neck pain   . Headache(784.0)    "migraines"  . Neuromuscular disorder (HCC)    carpal tunnel right side   History reviewed. No pertinent family history. Past Surgical History:  Procedure Laterality Date  . ANTERIOR CERVICAL DECOMP/DISCECTOMY FUSION  09/12/2012   Procedure: ANTERIOR CERVICAL DECOMPRESSION/DISCECTOMY FUSION 2 LEVELS;  Surgeon: Eldred Manges, MD;  Location: [facility] OR;  Service: Orthopedics;  Laterality: N/A;  C4-5, C5-6 Anterior Cervical Discectomy and Fusion, Bone Allograft, Plate  . SHOULDER SURGERY     right, 2005   Social History   Occupational  History  . Not on file  Tobacco Use  . Smoking status: Never Smoker  . Smokeless tobacco: Never Used  Substance and Sexual Activity  . Alcohol use: Not on file    Comment: "social"  . Drug use: No  . Sexual activity: Not on file

## 2018-05-20 NOTE — Procedures (Signed)
Cervical Epidural Steroid Injection - Interlaminar Approach with Fluoroscopic Guidance  Patient: Carla [name]      Date of Birth: Aug 28, 1971 MRN: [id number] PCP: [name], MD      Visit Date: 05/05/2018   Universal Protocol:    Date/Time: 09/06/196:05 AM  Consent Given By: the patient  Position: PRONE  Additional Comments: Vital signs were monitored before and after the procedure. Patient was prepped and draped in the usual sterile fashion. The correct patient, procedure, and site was verified.   Injection Procedure Details:  Procedure Site One Meds Administered:  Meds ordered this encounter  Medications  . methylPREDNISolone acetate (DEPO-MEDROL) injection 80 mg     Laterality: Right  Location/Site: C7-T1  Needle size: 20 G  Needle type: Touhy  Needle Placement: Paramedian epidural space  Findings:  -Comments: Excellent flow of contrast into the epidural space.  Procedure Details: Using a paramedian approach from the side mentioned above, the region overlying the inferior lamina was localized under fluoroscopic visualization and the soft tissues overlying this structure were infiltrated with 4 ml. of 1% Lidocaine without Epinephrine. A # 20 gauge, Tuohy needle was inserted into the epidural space using a paramedian approach.  The epidural space was localized using loss of resistance along with lateral and contralateral oblique bi-planar fluoroscopic views.  After negative aspirate for air, blood, and CSF, a 2 ml. volume of Isovue-250 was injected into the epidural space and the flow of contrast was observed. Radiographs were obtained for documentation purposes.   The injectate was administered into the level noted above.  Additional Comments:  The patient tolerated the procedure well Dressing: Band-Aid    Post-procedure details: Patient was observed during the procedure. Post-procedure instructions were reviewed.  Patient left the clinic in  stable condition.

## 2018-07-12 ENCOUNTER — Ambulatory Visit (INDEPENDENT_AMBULATORY_CARE_PROVIDER_SITE_OTHER): Payer: 59 | Admitting: Orthopaedic Surgery

## 2018-07-12 ENCOUNTER — Encounter (INDEPENDENT_AMBULATORY_CARE_PROVIDER_SITE_OTHER): Payer: Self-pay | Admitting: Orthopaedic Surgery

## 2018-07-12 ENCOUNTER — Ambulatory Visit (INDEPENDENT_AMBULATORY_CARE_PROVIDER_SITE_OTHER): Payer: 59

## 2018-07-12 VITALS — BP 107/70 | HR 69 | Ht 64.0 in | Wt 132.5 lb

## 2018-07-12 DIAGNOSIS — G8929 Other chronic pain: Secondary | ICD-10-CM

## 2018-07-12 DIAGNOSIS — M25561 Pain in right knee: Secondary | ICD-10-CM | POA: Diagnosis not present

## 2018-07-16 ENCOUNTER — Ambulatory Visit
Admission: RE | Admit: 2018-07-16 | Discharge: 2018-07-16 | Disposition: A | Discharge status: Home / Self Care | Payer: Managed Care, Other (non HMO) | Source: Ambulatory Visit | Attending: Orthopaedic Surgery | Admitting: Orthopaedic Surgery

## 2018-07-16 DIAGNOSIS — M25561 Pain in right knee: Principal | ICD-10-CM

## 2018-07-16 DIAGNOSIS — G8929 Other chronic pain: Secondary | ICD-10-CM

## 2018-07-23 ENCOUNTER — Encounter (INDEPENDENT_AMBULATORY_CARE_PROVIDER_SITE_OTHER): Payer: Self-pay | Admitting: Orthopaedic Surgery

## 2018-07-23 NOTE — Progress Notes (Signed)
Office Visit Note   Patient: Carla [name]           Date of Birth: [birth year]           MRN: [id number] Visit Date: 07/12/2018              Requested by: [name], MD [address] [place], [place] [address] PCP: [name], MD   Assessment & Plan: Visit Diagnoses:  1. Chronic pain of right knee     Plan: Patient is having her repetitive locking and giving way of her right knee.  We will proceed with an MRI scan to rule out meniscal tear with recurrent displacement.  Office follow-up after MRI scan for review.  Follow-Up Instructions: No follow-ups on file.   Orders:  Orders Placed This Encounter  Procedures  . XR KNEE 3 VIEW RIGHT  . MR Knee Right w/o contrast   No orders of the defined types were placed in this encounter.     Procedures: No procedures performed   Clinical Data: No additional findings.   Subjective: Chief Complaint  Patient presents with  . Right Knee - Pain    HPI 47 year old female seen with right knee pain with repetitive giving way and intermittent locking.  She states when she tries to go up and down stairs at times her knee gives way if she does not grab the railing or wall she will fall.  No problems with her opposite left knee.  After the episode her knee swells for appear to time aches and then gradually improves.  Previous C4-5 C5-6 2 level cervical fusion with epidural injection the summer of this year.  Review of Systems  Constitutional: Negative for chills and diaphoresis.  HENT: Negative for ear discharge, ear pain and nosebleeds.   Eyes: Negative for discharge and visual disturbance.  Respiratory: Negative for cough, choking and shortness of breath.   Cardiovascular: Negative for chest pain and palpitations.  Gastrointestinal: Negative for abdominal distention and abdominal pain.  Endocrine: Negative for cold intolerance and heat intolerance.  Genitourinary: Negative for flank pain and hematuria.  Skin:  Negative for rash and wound.  Neurological: Negative for seizures and speech difficulty.  Hematological: Negative for adenopathy. Does not bruise/bleed easily.  Psychiatric/Behavioral: Negative for agitation and suicidal ideas.  Positive for joint pain neck pain previous to level cervical fusion.  Bilateral shoulder pain.  Right knee locking and giving way.   Objective: Vital Signs: BP 107/70   Pulse 69   Ht 5\' 4"  (1.626 m)   Wt 132 lb 8 oz (60.1 kg)   BMI 22.74 kg/m   Physical Exam  Constitutional: She is oriented to person, place, and time. She appears well-developed.  HENT:  Head: Normocephalic.  Right Ear: External ear normal.  Left Ear: External ear normal.  Eyes: Pupils are equal, round, and reactive to light.  Neck: No tracheal deviation present. No thyromegaly present.  Cardiovascular: Normal rate.  Pulmonary/Chest: Effort normal.  Abdominal: Soft.  Neurological: She is alert and oriented to person, place, and time.  Skin: Skin is warm and dry.  Psychiatric: She has a normal mood and affect. Her behavior is normal.    Ortho Exam patient has flexion-extension of her knee with full extension flexion 130 degrees.  Pain with hyperextension.  Medial joint line tenderness and posterior medial joint line tenderness.  Mild crepitus with knee extension pain with patellar loading.  Nontender plica.  Negative pivot shift PCL test is normal distal pulses are  normal negative logroll to the hips.  Specialty Comments:  No specialty comments available.  Imaging: No results found.   PMFS History: Patient Active Problem List   Diagnosis Date Noted  . Cervical spondylosis with radiculopathy 09/12/2012  . HYPERLIPIDEMIA 12/23/2009  . TONSILLITIS, ACUTE 12/20/2009  . 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 ALLERGIC RHINITIS CAUSE UNSPECIFIED 09/28/2008  . OTITIS MEDIA, ACUTE, RIGHT 01/06/2008   Past Medical History:  Diagnosis Date  . Chronic neck pain   .  Headache(784.0)    "migraines"  . Neuromuscular disorder (HCC)    carpal tunnel right side    No family history on file.  Past Surgical History:  Procedure Laterality Date  . ANTERIOR CERVICAL DECOMP/DISCECTOMY FUSION  09/12/2012   Procedure: ANTERIOR CERVICAL DECOMPRESSION/DISCECTOMY FUSION 2 LEVELS;  Surgeon: Eldred Manges, MD;  Location: [facility] OR;  Service: Orthopedics;  Laterality: N/A;  C4-5, C5-6 Anterior Cervical Discectomy and Fusion, Bone Allograft, Plate  . SHOULDER SURGERY     right, 2005   Social History   Occupational History  . Not on file  Tobacco Use  . Smoking status: Never Smoker  . Smokeless tobacco: Never Used  Substance and Sexual Activity  . Alcohol use: Not on file    Comment: "social"  . Drug use: No  . Sexual activity: Not on file

## 2018-07-27 ENCOUNTER — Ambulatory Visit (INDEPENDENT_AMBULATORY_CARE_PROVIDER_SITE_OTHER): Payer: 59 | Admitting: Orthopaedic Surgery

## 2018-07-27 ENCOUNTER — Encounter (INDEPENDENT_AMBULATORY_CARE_PROVIDER_SITE_OTHER): Payer: Self-pay | Admitting: Orthopaedic Surgery

## 2018-07-27 VITALS — BP 110/66 | HR 89 | Ht 64.0 in | Wt 132.0 lb

## 2018-07-27 DIAGNOSIS — Z981 Arthrodesis status: Secondary | ICD-10-CM | POA: Diagnosis not present

## 2018-07-27 DIAGNOSIS — G8929 Other chronic pain: Secondary | ICD-10-CM | POA: Insufficient documentation

## 2018-07-27 DIAGNOSIS — M25561 Pain in right knee: Secondary | ICD-10-CM | POA: Diagnosis not present

## 2018-07-27 NOTE — Progress Notes (Signed)
Office Visit Note   Patient: Carla [name]           Date of Birth: [birth year]           MRN: [id number] Visit Date: 07/27/2018              Requested by: [name], MD [address] [place], [place] [address] PCP: [name], MD   Assessment & Plan: Visit Diagnoses:  1. Chronic pain of right knee   2. History of fusion of cervical spine     Plan: MRI scan is reviewed with her images and I gave her a copy of the report.  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  Menisci and ligaments are intact.  We discussed work activities that would avoid significant patellofemoral loading.  Exercises such as using exercise bike elliptical on the flat would be better than impact loading.  She should avoid jumping squats and lunging activities which are likely to aggravate her symptoms.  We will send her for physical therapy for terminal arc extension exercises from 0 to 20 degrees flexion.  Isometric quad work and straight leg raising.  I plan to check on follow-up in 1 month.  Follow-Up Instructions: Return in about 1 month (around 08/26/2018).   Orders:  Orders Placed This Encounter  Procedures  . Ambulatory referral to Physical Therapy   No orders of the defined types were placed in this encounter.     Procedures: No procedures performed   Clinical Data: No additional findings.   Subjective: Chief Complaint  Patient presents with  . Right Knee - Follow-up    MRI Right Knee Review    HPI 47 year old female returns with ongoing problems with her right knee.  She principally has pain with activities that load the patellofemoral joint.  She recently signed up for a boxing exercise type class.  She has problems with stairs and lunges.  She had sudden catching grabbing and intermittent locking with knee flexion..  Previous cervical fusion 2013 doing well.  She has had occasional swelling  in her knee particularly after patellofemoral loading activities.  No problems with her opposite left knee.  MRI scan of her right knee has been obtained and is available for review.  Review of Systems review of systems updated unchanged from 07/12/2018.  Positive for cervical fusion 2013 at C4-5 and C5-6.   Objective: Vital Signs: BP 110/66   Pulse 89   Ht 5\' 4"  (1.626 m)   Wt 132 lb (59.9 kg)   BMI 22.66 kg/m   Physical Exam  Constitutional: She is oriented to person, place, and time. She appears well-developed.  HENT:  Head: Normocephalic.  Right Ear: External ear normal.  Left Ear: External ear normal.  Eyes: Pupils are equal, round, and reactive to light.  Neck: No tracheal deviation present. No thyromegaly present.  Cardiovascular: Normal rate.  Pulmonary/Chest: Effort normal.  Abdominal: Soft.  Neurological: She is alert and oriented to person, place, and time.  Skin: Skin is warm and dry.  Psychiatric: She has a normal mood and affect. Her behavior is normal.    Ortho Exam normal hip range of motion patient can ambulate.  When she squats she has discomfort in her knee.  Some crepitus with patellofemoral loading right knee only none on the left.  Collateral ligaments are stable crucial exam is normal.  Distal pulses are intact no calf tenderness.  No rash over  exposed skin.  Specialty Comments:  No specialty comments available.  Imaging: CLINICAL DATA:  Right knee pain gives out for 1 year.  Crepitus.  EXAM: MRI OF THE RIGHT KNEE WITHOUT CONTRAST  TECHNIQUE: Multiplanar, multisequence MR imaging of the knee was performed. No intravenous contrast was administered.  COMPARISON:  None.  FINDINGS: MENISCI  Medial meniscus:  Intact.  Lateral meniscus:  Intact.  LIGAMENTS  Cruciates:  Intact ACL and PCL.  Collaterals: Medial collateral ligament is intact. Lateral collateral ligament complex is intact.  CARTILAGE  Patellofemoral: Small focal  chondromalacia and mild subchondral reactive marrow changes at the trochlear groove without a focal chondral defect.  Medial:  No chondral defect.  Lateral:  No chondral defect.  Joint: No joint effusion. Normal Hoffa's fat. No plical thickening.  Popliteal Fossa:  No Baker's cyst.  Intact popliteus tendon.  Extensor Mechanism: Intact quadriceps tendon. Intact patellar tendon. Intact medial patellar retinaculum. Intact lateral patellar retinaculum. Intact MPFL.  Bones:  No acute osseous abnormality.  No aggressive osseous lesion.  Other: No fluid collection or hematoma.  Muscles are normal.  IMPRESSION: 1. No meniscal or ligamentous injury of the right knee. 2. Small focal chondromalacia and mild subchondral reactive marrow changes at the trochlear groove without a focal chondral defect.   Electronically Signed   By: Elige KoHetal  Patel   On: 07/16/2018 09:13    PMFS History: Patient Active Problem List   Diagnosis Date Noted  . History of fusion of cervical spine 07/27/2018  . Chronic pain of right knee 07/27/2018  . HYPERLIPIDEMIA 12/23/2009  . TONSILLITIS, ACUTE 12/20/2009  . 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 ALLERGIC RHINITIS CAUSE UNSPECIFIED 09/28/2008  . OTITIS MEDIA, ACUTE, RIGHT 01/06/2008   Past Medical History:  Diagnosis Date  . Chronic neck pain   . Headache(784.0)    "migraines"  . Neuromuscular disorder (HCC)    carpal tunnel right side    No family history on file.  Past Surgical History:  Procedure Laterality Date  . ANTERIOR CERVICAL DECOMP/DISCECTOMY FUSION  09/12/2012   Procedure: ANTERIOR CERVICAL DECOMPRESSION/DISCECTOMY FUSION 2 LEVELS;  Surgeon: Eldred MangesMark C Talaysia Pinheiro, MD;  Location: [facility] OR;  Service: Orthopedics;  Laterality: N/A;  C4-5, C5-6 Anterior Cervical Discectomy and Fusion, Bone Allograft, Plate  . SHOULDER SURGERY     right, 2005   Social History   Occupational History  . Not on file  Tobacco Use  . Smoking  status: Never Smoker  . Smokeless tobacco: Never Used  Substance and Sexual Activity  . Alcohol use: Not on file    Comment: "social"  . Drug use: No  . Sexual activity: Not on file

## 2018-08-22 ENCOUNTER — Ambulatory Visit: Payer: 59 | Attending: Orthopaedic Surgery | Admitting: Physical Therapy

## 2018-08-22 ENCOUNTER — Other Ambulatory Visit: Payer: Self-pay

## 2018-08-22 ENCOUNTER — Encounter: Payer: Self-pay | Admitting: Physical Therapy

## 2018-08-22 DIAGNOSIS — M25561 Pain in right knee: Secondary | ICD-10-CM | POA: Insufficient documentation

## 2018-08-22 DIAGNOSIS — M25562 Pain in left knee: Secondary | ICD-10-CM | POA: Diagnosis present

## 2018-08-22 DIAGNOSIS — G8929 Other chronic pain: Secondary | ICD-10-CM | POA: Insufficient documentation

## 2018-08-22 DIAGNOSIS — R29898 Other symptoms and signs involving the musculoskeletal system: Secondary | ICD-10-CM | POA: Diagnosis present

## 2018-08-22 NOTE — Therapy (Signed)
[facility] [address]  [address] [place], [place], [address] Phone: [phone]   Fax:  [phone]  Physical Therapy Evaluation  Patient Details  Name: Carla [name] MRN: [id number] Date of Birth: May 30, 1971 Referring Provider (PT): Annell Greening, MD   Encounter Date: 08/22/2018  PT End of Session - 08/22/18 0942    Visit Number  1    Number of Visits  13    Date for PT Re-Evaluation  10/03/18    Authorization Type  [employer]    PT Start Time  [phone]   patient late   PT Stop Time  0845    PT Time Calculation (min)  34 min    Activity Tolerance  Patient tolerated treatment well    Behavior During Therapy  [facility] for tasks assessed/performed       Past Medical History:  Diagnosis Date  . Chronic neck pain   . Headache(784.0)    "migraines"  . Neuromuscular disorder (HCC)    carpal tunnel right side    Past Surgical History:  Procedure Laterality Date  . ANTERIOR CERVICAL DECOMP/DISCECTOMY FUSION  09/12/2012   Procedure: ANTERIOR CERVICAL DECOMPRESSION/DISCECTOMY FUSION 2 LEVELS;  Surgeon: Eldred Manges, MD;  Location: [facility] OR;  Service: Orthopedics;  Laterality: N/A;  C4-5, C5-6 Anterior Cervical Discectomy and Fusion, Bone Allograft, Plate  . SHOULDER SURGERY     right, 2005    There were no vitals filed for this visit.   Subjective Assessment - 08/22/18 0813    Subjective  Patient reports insidious onset of R knee painful buckling for the past 2 years. Also reports new onset of L knee pain without buckling for the past month since starting kickboxing class.  Patient reports when she is going up/down stairs it will give out randomly, like she cant put weight through it. R worse than L. Also cannot do the jumping portion of a jump squat. MD advised her to avoid jumping and lunges for the time being. Easing factors include stopping aggravating activities.  Pain is located over B patellae.  Denies N/T or radiation.     Pertinent History  carpal tunnel syndrome R, HA, chronic neck pain, R shoulder surgery 2005, ACDF 2013    Limitations  House hold activities;Other (comment)   gym activities, stairs   How long can you sit comfortably?  unlimited    How long can you stand comfortably?  unlimited    How long can you walk comfortably?  unlimted    Diagnostic tests  07/16/18 R knee MRI: No meniscal or ligamentous injury of the right knee. Small focal chondromalacia and mild subchondral reactive marrow changes at the trochlear groove without a focal defect    Patient Stated Goals  "not feeling the pain anymore and not have my knee go under me anymore"    Currently in Pain?  Yes    Pain Score  0-No pain    Pain Location  Knee    Pain Orientation  Right;Left;Anterior    Pain Descriptors / Indicators  Dull;Aching    Pain Type  Chronic pain         [facility] PT Assessment - 08/22/18 0820      Assessment   Medical Diagnosis  Chronic Pain of R Knee    Referring Provider (PT)  Annell Greening, MD    Onset Date/Surgical Date  08/22/16    Next MD Visit  08/26/18    Prior Therapy  No  Precautions   Precautions  None   no jmuping or lunges     Restrictions   Weight Bearing Restrictions  No      Balance Screen   Has the patient fallen in the past 6 months  No    Has the patient had a decrease in activity level because of a fear of falling?   No    Is the patient reluctant to leave their home because of a fear of falling?   No      Home Environment   Living Environment  Private residence    Type of Home  House    Home Access  Stairs to enter    Entrance Stairs-Number of Steps  1    Home Layout  Two level    Alternate Level Stairs-Number of Steps  15    Alternate Level Stairs-Rails  Right      Prior Function   Level of Independence  Independent    Vocation  Full time employment    Vocation Requirements  sitting at a computer    Leisure  working out      [employer]   Overall Cognitive Status  Within  Functional Limits for tasks assessed      Observation/Other Assessments   Observations  no swelling observed in either knee    Focus on Therapeutic Outcomes (FOTO)   next session      Sensation   Light Touch  Appears Intact      Coordination   Gross Motor Movements are Fluid and Coordinated  Yes      Posture/Postural Control   Posture/Postural Control  Postural limitations    Postural Limitations  Rounded Shoulders;Forward head      ROM / Strength   AROM / PROM / Strength  AROM;PROM;Strength      AROM   AROM Assessment Site  Knee    Right/Left Hip  --    Right/Left Knee  Right;Left    Right Knee Extension  -1    Right Knee Flexion  129    Left Knee Extension  -1    Left Knee Flexion  128      PROM   PROM Assessment Site  --    Right/Left Hip  --    Right/Left Knee  Right;Left    Right Knee Extension  -2    Right Knee Flexion  135    Left Knee Extension  -2    Left Knee Flexion  132      Strength   Strength Assessment Site  Hip;Knee;Ankle    Right/Left Hip  Right;Left    Right Hip Flexion  4+/5    Right Hip ABduction  4+/5    Right Hip ADduction  4+/5    Left Hip Flexion  4/5    Left Hip ABduction  4+/5    Left Hip ADduction  4+/5    Right/Left Knee  Left;Right    Right Knee Flexion  4+/5    Right Knee Extension  4+/5    Left Knee Flexion  4+/5    Left Knee Extension  4+/5    Right/Left Ankle  Right;Left    Right Ankle Dorsiflexion  4+/5    Right Ankle Plantar Flexion  4+/5    Left Ankle Dorsiflexion  4+/5    Left Ankle Plantar Flexion  4+/5      Flexibility   Soft Tissue Assessment /Muscle Length  yes    Hamstrings  mildly tight B  Quadriceps  moderately tight B      Palpation   Patella mobility  WNL B patellar mobility; slight discomfort with R superior glide    Palpation comment  no TTP or edema      Ambulation/Gait   Gait Pattern  Step-through pattern;Within Functional Limits                Objective measurements completed on  examination: See above findings.              PT Education - 08/22/18 [phone]    Education Details  prognosis, POC, HEP, edu on use of fabric knee sleeve for proprioceptive input    Person(s) Educated  Patient    Methods  Explanation;Demonstration;Tactile cues;Verbal cues;Handout    Comprehension  Verbalized understanding;Returned demonstration       PT Short Term Goals - 08/22/18 0950      PT SHORT TERM GOAL #1   Title  Patient to be independent with initial HEP.    Time  3    Period  Weeks    Status  New    Target Date  09/12/18        PT Long Term Goals - 08/22/18 0951      PT LONG TERM GOAL #1   Title  Patient to be independent with advanced HEP.    Time  6    Period  Weeks    Status  New    Target Date  10/03/18      PT LONG TERM GOAL #2   Title  Patient to demonstrate good eccentric control when stair climbing down 13 steps without handrail.    Time  6    Period  Weeks    Status  New    Target Date  10/03/18      PT LONG TERM GOAL #3   Title  Patient to demonstrate no flexibility impairments in B HS or quads.    Time  6    Period  Weeks    Status  New    Target Date  10/03/18      PT LONG TERM GOAL #4   Title  Patient to report return to normal excise routine without restrictions or pain.     Time  6    Period  Weeks    Status  New    Target Date  10/03/18             Plan - 08/22/18 0943    Clinical Impression Statement  Patient is a 47y/o F presenting to [facility] with c/o B knee pain and buckling, R worse than L. Patient has been symptomatic on R side for 2 years, 1 month since starting kickboxing on L side. Recent MRI revealed small chondromalacia in R knee. Patient reports aggravating factors are climbing up/down stairs and jumping. MD advised her to avoid jumping and lunges for the time being. Pain is located over B patellae.  Patient today with good overall LE strength, WFL and nonpainful B knee AROM/PROM, good patellar mobility on B sides  with mild discomfort on R with superior glide, and decreased HS and quad flexibility. Educated patient on strengthening and stretching HEP. Patient reported understanding. Would benefit from skilled PT services 2x/week for 6 weeks to address aforementioned impairments.     Clinical Presentation  Stable    Clinical Decision Making  Low    Rehab Potential  Good    Clinical Impairments Affecting Rehab Potential  carpal tunnel syndrome R,  HA, chronic neck pain, R shoulder surgery 2005, ACDF 2013    PT Frequency  2x / week    PT Duration  6 weeks    PT Treatment/Interventions  ADLs/Self Care Home Management;Cryotherapy;Electrical Stimulation;Moist Heat;Ultrasound;DME Instruction;Gait training;Stair training;Functional mobility training;Therapeutic activities;Therapeutic exercise;Manual techniques;Orthotic Fit/Training;Patient/family education;Neuromuscular re-education;Balance training;Passive range of motion;Dry needling;Energy conservation;Splinting;Taping;Vasopneumatic Device    PT Next Visit Plan  reassess HEP; FOTO    Consulted and Agree with Plan of Care  Patient       Patient will benefit from skilled therapeutic intervention in order to improve the following deficits and impairments:  Decreased activity tolerance, Decreased strength, Pain, Other (comment), Postural dysfunction, Impaired flexibility, Difficulty walking(difficulty with high impact activities, difficulty with stairs)  Visit Diagnosis: Chronic pain of right knee  Acute pain of left knee  Other symptoms and signs involving the musculoskeletal system     Problem List Patient Active Problem List   Diagnosis Date Noted  . History of fusion of cervical spine 07/27/2018  . Chronic pain of right knee 07/27/2018  . HYPERLIPIDEMIA 12/23/2009  . TONSILLITIS, ACUTE 12/20/2009  . 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 ALLERGIC RHINITIS CAUSE UNSPECIFIED 09/28/2008  . OTITIS MEDIA, ACUTE, RIGHT 01/06/2008     Anette Guarneri, PT, DPT 08/22/18 9:55 AM   [facility] [address]  [address] [place], [place], [address] Phone: [phone]   Fax:  [phone]  Name: Ahmiya Abee MRN: [id number] Date of Birth: 06-Jun-1971

## 2018-08-24 ENCOUNTER — Ambulatory Visit: Payer: 59

## 2018-08-24 DIAGNOSIS — G8929 Other chronic pain: Secondary | ICD-10-CM

## 2018-08-24 DIAGNOSIS — M25562 Pain in left knee: Secondary | ICD-10-CM

## 2018-08-24 DIAGNOSIS — M25561 Pain in right knee: Principal | ICD-10-CM

## 2018-08-24 DIAGNOSIS — R29898 Other symptoms and signs involving the musculoskeletal system: Secondary | ICD-10-CM

## 2018-08-24 NOTE — Therapy (Signed)
[facility] Outpatient Rehabilitation [facility] [address]  [address] [place], [place], [address] Phone: [phone]   Fax:  [phone]  Physical Therapy Treatment  Patient Details  Name: Carla [name] MRN: [id number] Date of Birth: [birth year] Referring Provider (PT): Annell Greening, MD   Encounter Date: 08/24/2018  PT End of Session - 08/24/18 0805    Visit Number  2    Number of Visits  13    Date for PT Re-Evaluation  10/03/18    Authorization Type  Aetna    PT Start Time  0802    PT Stop Time  0844    PT Time Calculation (min)  42 min    Activity Tolerance  Patient tolerated treatment well    Behavior During Therapy  [facility] for tasks assessed/performed       Past Medical History:  Diagnosis Date  . Chronic neck pain   . Headache(784.0)    "migraines"  . Neuromuscular disorder (HCC)    carpal tunnel right side    Past Surgical History:  Procedure Laterality Date  . ANTERIOR CERVICAL DECOMP/DISCECTOMY FUSION  09/12/2012   Procedure: ANTERIOR CERVICAL DECOMPRESSION/DISCECTOMY FUSION 2 LEVELS;  Surgeon: Eldred Manges, MD;  Location: [facility] OR;  Service: Orthopedics;  Laterality: N/A;  C4-5, C5-6 Anterior Cervical Discectomy and Fusion, Bone Allograft, Plate  . SHOULDER SURGERY     right, 2005    There were no vitals filed for this visit.  Subjective Assessment - 08/24/18 0804    Subjective  Pt. noting jumping jacks, jump squats, and climbings stairs caused her pain yesterday.  Attends a group class.      Pertinent History  carpal tunnel syndrome R, HA, chronic neck pain, R shoulder surgery 2005, ACDF 2013    Diagnostic tests  07/16/18 R knee MRI: No meniscal or ligamentous injury of the right knee. Small focal chondromalacia and mild subchondral reactive marrow changes at the trochlear groove without a focal defect    Patient Stated Goals  "not feeling the pain anymore and not have my knee go under me anymore"    Currently in Pain?  No/denies    Pain  Score  0-No pain   up to 5/10 pain at worst with stairs    Pain Location  Knee    Pain Orientation  Right;Anterior    Pain Descriptors / Indicators  Dull;Aching   "sharp and catching" when climbing stairs    Pain Type  Chronic pain    Multiple Pain Sites  No         [facility] PT Assessment - 08/24/18 0858      Observation/Other Assessments   Focus on Therapeutic Outcomes (FOTO)   59% (41% limitation)                   [facility] Adult PT Treatment/Exercise - 08/24/18 0815      Knee/Hip Exercises: Stretches   Passive Hamstring Stretch  Right;1 rep;30 seconds    Passive Hamstring Stretch Limitations  strap     Quad Stretch  Right;1 rep;60 seconds    Quad Stretch Limitations  prone with strap     Hip Flexor Stretch  Right;30 seconds;2 reps    Hip Flexor Stretch Limitations  with strap     Piriformis Stretch  Right;2 reps;30 seconds    Piriformis Stretch Limitations  KTOS       Knee/Hip Exercises: Aerobic   Nustep  Lvl 4, 6 min  Knee/Hip Exercises: Standing   Terminal Knee Extension  Right;Strengthening;20 reps;Theraband    Theraband Level (Terminal Knee Extension)  Level 4 (Blue)    Terminal Knee Extension Limitations  Cues for TKE and R wt. shift required with blue TB closed in door    Forward Step Up  Right;15 reps;Step Height: 6";Hand Hold: 1    Forward Step Up Limitations  red TB TKE at top of movement       Knee/Hip Exercises: Supine   Straight Leg Raises  Right;10 reps;Strengthening   Cues required for quad set prior to each rep    Straight Leg Raises Limitations  1#               PT Short Term Goals - 08/24/18 0806      PT SHORT TERM GOAL #1   Title  Patient to be independent with initial HEP.    Time  3    Period  Weeks    Status  On-going        PT Long Term Goals - 08/24/18 [id number]      PT LONG TERM GOAL #1   Title  Patient to be independent with advanced HEP.    Time  6    Period  Weeks    Status  On-going      PT LONG TERM GOAL  #2   Title  Patient to demonstrate good eccentric control when stair climbing down 13 steps without handrail.    Time  6    Period  Weeks    Status  On-going      PT LONG TERM GOAL #3   Title  Patient to demonstrate no flexibility impairments in B HS or quads.    Time  6    Period  Weeks    Status  On-going      PT LONG TERM GOAL #4   Title  Patient to report return to normal excise routine without restrictions or pain.     Time  6    Period  Weeks    Status  On-going            Plan - 08/24/18 0807    Clinical Impression Statement  Pt. reporting she has been unable to perform HEP since eval.  Did require min cueing with HEP review for proper technique with standing TKE with band closed in door.  Tolerated all quad strengthening activities in session well today and verbalized that she will plan to be consistent with HEP performance over weekend.  Making progress toward STG #1.    Clinical Impairments Affecting Rehab Potential  carpal tunnel syndrome R, HA, chronic neck pain, R shoulder surgery 2005, ACDF 2013    PT Frequency  2x / week    PT Duration  6 weeks    PT Treatment/Interventions  ADLs/Self Care Home Management;Cryotherapy;Electrical Stimulation;Moist Heat;Ultrasound;DME Instruction;Gait training;Stair training;Functional mobility training;Therapeutic activities;Therapeutic exercise;Manual techniques;Orthotic Fit/Training;Patient/family education;Neuromuscular re-education;Balance training;Passive range of motion;Dry needling;Energy conservation;Splinting;Taping;Vasopneumatic Device    Consulted and Agree with Plan of Care  Patient       Patient will benefit from skilled therapeutic intervention in order to improve the following deficits and impairments:  Decreased activity tolerance, Decreased strength, Pain, Other (comment), Postural dysfunction, Impaired flexibility, Difficulty walking  Visit Diagnosis: Chronic pain of right knee  Acute pain of left knee  Other  symptoms and signs involving the musculoskeletal system     Problem List Patient Active Problem List   Diagnosis Date Noted  .  History of fusion of cervical spine 07/27/2018  . Chronic pain of right knee 07/27/2018  . HYPERLIPIDEMIA 12/23/2009  . TONSILLITIS, ACUTE 12/20/2009  . 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 ALLERGIC RHINITIS CAUSE UNSPECIFIED 09/28/2008  . OTITIS MEDIA, ACUTE, RIGHT 01/06/2008    Kermit Balo, PTA 08/24/18 9:04 AM   [facility] Rehabilitation [facility] [address]  [address] [place], [place], [address] Phone: [phone]   Fax:  [phone]  Name: Carla [name] MRN: [id number] Date of Birth: 1971/03/14

## 2018-08-26 ENCOUNTER — Ambulatory Visit (INDEPENDENT_AMBULATORY_CARE_PROVIDER_SITE_OTHER): Payer: 59 | Admitting: Orthopaedic Surgery

## 2018-09-01 ENCOUNTER — Ambulatory Visit: Payer: 59

## 2018-09-01 DIAGNOSIS — M25561 Pain in right knee: Secondary | ICD-10-CM | POA: Diagnosis not present

## 2018-09-01 DIAGNOSIS — G8929 Other chronic pain: Secondary | ICD-10-CM

## 2018-09-01 DIAGNOSIS — R29898 Other symptoms and signs involving the musculoskeletal system: Secondary | ICD-10-CM

## 2018-09-01 DIAGNOSIS — M25562 Pain in left knee: Secondary | ICD-10-CM

## 2018-09-01 NOTE — Therapy (Signed)
[facility] [place] [address]  [address] [place], [place], [address] Phone: [phone]   Fax:  [phone]  Physical Therapy Treatment  Patient Details  Name: Carla IgoVicki Kay Lapierre MRN: [id number] Date of Birth: 1971-07-05 Referring Provider (PT): Annell GreeningMark Yates, MD   Encounter Date: 09/01/2018  PT End of Session - 09/01/18 0942    Visit Number  3    Number of Visits  13    Date for PT Re-Evaluation  10/03/18    Authorization Type  Aetna    PT Start Time  [phone]   pt. arrived late    PT Stop Time  1012    PT Time Calculation (min)  33 min    Activity Tolerance  Patient tolerated treatment well    Behavior During Therapy  [facility] for tasks assessed/performed       Past Medical History:  Diagnosis Date  . Chronic neck pain   . Headache(784.0)    "migraines"  . Neuromuscular disorder (HCC)    carpal tunnel right side    Past Surgical History:  Procedure Laterality Date  . ANTERIOR CERVICAL DECOMP/DISCECTOMY FUSION  09/12/2012   Procedure: ANTERIOR CERVICAL DECOMPRESSION/DISCECTOMY FUSION 2 LEVELS;  Surgeon: Eldred MangesMark C Yates, MD;  Location: [facility] OR;  Service: Orthopedics;  Laterality: N/A;  C4-5, C5-6 Anterior Cervical Discectomy and Fusion, Bone Allograft, Plate  . SHOULDER SURGERY     right, 2005    There were no vitals filed for this visit.  Subjective Assessment - 09/01/18 0940    Subjective  Pt. reporting she still has knee pain navigating stairs     Pertinent History  carpal tunnel syndrome R, HA, chronic neck pain, R shoulder surgery 2005, ACDF 2013    Diagnostic tests  07/16/18 R knee MRI: No meniscal or ligamentous injury of the right knee. Small focal chondromalacia and mild subchondral reactive marrow changes at the trochlear groove without a focal defect    Patient Stated Goals  "not feeling the pain anymore and not have my knee go under me anymore"    Currently in Pain?  No/denies    Pain Score  0-No pain   Pt. reporting 7/10  R knee pain with stairs and 5/10 L knee pain with stairs    Pain Location  Knee    Pain Orientation  Right;Left;Anterior    Pain Descriptors / Indicators  Sharp    Pain Type  Chronic pain    Aggravating Factors   stairs, lunges     Pain Relieving Factors  rest     Multiple Pain Sites  No                       [facility] Adult PT Treatment/Exercise - 09/01/18 0951      Knee/Hip Exercises: Stretches   Passive Hamstring Stretch  Right;Left;30 seconds    Passive Hamstring Stretch Limitations  strap     Quad Stretch  Right;Left;1 rep;60 seconds    Quad Stretch Limitations  prone with strap     ITB Stretch  Right;Left;1 rep;30 seconds    ITB Stretch Limitations  strap     Piriformis Stretch  Right;2 reps;30 seconds    Piriformis Stretch Limitations  KTOS     Gastroc Stretch  Right;Left;2 reps;30 seconds    Gastroc Stretch Limitations  Prostretch     Other Knee/Hip Stretches  Side stepping with red TB at forefoot 2 x 30 ft  Knee/Hip Exercises: Aerobic   Recumbent Bike  Lvl 2, 7 min       Knee/Hip Exercises: Standing   Forward Step Up  10 reps;Right;Left;Step Height: 8";Hand Hold: 1    Forward Step Up Limitations  1 Ue support on machine bolster     Step Down  Right;Left;10 reps;Hand Hold: 2               PT Short Term Goals - 08/24/18 0806      PT SHORT TERM GOAL #1   Title  Patient to be independent with initial HEP.    Time  3    Period  Weeks    Status  On-going        PT Long Term Goals - 08/24/18 4098      PT LONG TERM GOAL #1   Title  Patient to be independent with advanced HEP.    Time  6    Period  Weeks    Status  On-going      PT LONG TERM GOAL #2   Title  Patient to demonstrate good eccentric control when stair climbing down 13 steps without handrail.    Time  6    Period  Weeks    Status  On-going      PT LONG TERM GOAL #3   Title  Patient to demonstrate no flexibility impairments in B HS or quads.    Time  6    Period  Weeks     Status  On-going      PT LONG TERM GOAL #4   Title  Patient to report return to normal excise routine without restrictions or pain.     Time  6    Period  Weeks    Status  On-going            Plan - 09/01/18 1191    Clinical Impression Statement  Pt. arrived late to session thus treatment time limited.  tolerated progression to 4" eccentric step-downs, and 8" step-ups well today.  Did note visible ongoing quad/HS tightness today along with limited flexibility in B ITB with LE stretching.  Only pain in today's visit was short-lasting R knee pain with step ups that subsided with rest.  Will continue to progress toward goals.  Did discuss trial of K-taping with pt. to R and or B knees however deferred today due to limitation from pt. leggings today.  Pt. planning on attending PT with alternate pants for possible taping tomorrow.      Rehab Potential  Good    Clinical Impairments Affecting Rehab Potential  carpal tunnel syndrome R, HA, chronic neck pain, R shoulder surgery 2005, ACDF 2013    PT Frequency  2x / week    PT Duration  6 weeks    PT Treatment/Interventions  ADLs/Self Care Home Management;Cryotherapy;Electrical Stimulation;Moist Heat;Ultrasound;DME Instruction;Gait training;Stair training;Functional mobility training;Therapeutic activities;Therapeutic exercise;Manual techniques;Orthotic Fit/Training;Patient/family education;Neuromuscular re-education;Balance training;Passive range of motion;Dry needling;Energy conservation;Splinting;Taping;Vasopneumatic Device    Consulted and Agree with Plan of Care  Patient       Patient will benefit from skilled therapeutic intervention in order to improve the following deficits and impairments:  Decreased activity tolerance, Decreased strength, Pain, Other (comment), Postural dysfunction, Impaired flexibility, Difficulty walking  Visit Diagnosis: Chronic pain of right knee  Acute pain of left knee  Other symptoms and signs involving  the musculoskeletal system     Problem List Patient Active Problem List   Diagnosis Date Noted  . History of  fusion of cervical spine 07/27/2018  . Chronic pain of right knee 07/27/2018  . HYPERLIPIDEMIA 12/23/2009  . TONSILLITIS, ACUTE 12/20/2009  . 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 ALLERGIC RHINITIS CAUSE UNSPECIFIED 09/28/2008  . OTITIS MEDIA, ACUTE, RIGHT 01/06/2008    Kermit BaloMicah Dynastie Knoop, PTA 09/01/18 10:21 AM   [facility] [place] [address]  [address] [place], [place], [address] Phone: [phone]   Fax:  [phone]  Name: Carla IgoVicki Kay Saraceni MRN: [id number] Date of Birth: Jun 30, 1971

## 2018-09-02 ENCOUNTER — Encounter: Payer: Self-pay | Admitting: Physical Therapy

## 2018-09-02 ENCOUNTER — Ambulatory Visit: Payer: 59 | Admitting: Physical Therapy

## 2018-09-02 DIAGNOSIS — R29898 Other symptoms and signs involving the musculoskeletal system: Secondary | ICD-10-CM

## 2018-09-02 DIAGNOSIS — G8929 Other chronic pain: Secondary | ICD-10-CM

## 2018-09-02 DIAGNOSIS — M25561 Pain in right knee: Secondary | ICD-10-CM | POA: Diagnosis not present

## 2018-09-02 DIAGNOSIS — M25562 Pain in left knee: Secondary | ICD-10-CM

## 2018-09-02 NOTE — Therapy (Signed)
[facility] Outpatient Rehabilitation [facility] [address]  [address] [place], [place], [address] Phone: [phone]   Fax:  [phone]  Physical Therapy Treatment  Patient Details  Name: Carla [name] MRN: [id number] Date of Birth: 12/21/1970 Referring Provider (PT): Annell Greening, MD   Encounter Date: 09/02/2018  PT End of Session - 09/02/18 1212    Visit Number  4    Number of Visits  13    Date for PT Re-Evaluation  10/03/18    Authorization Type  Aetna    PT Start Time  0850    PT Stop Time  0931    PT Time Calculation (min)  41 min    Activity Tolerance  Patient tolerated treatment well;Patient limited by pain    Behavior During Therapy  [facility] for tasks assessed/performed       Past Medical History:  Diagnosis Date  . Chronic neck pain   . Headache(784.0)    "migraines"  . Neuromuscular disorder (HCC)    carpal tunnel right side    Past Surgical History:  Procedure Laterality Date  . ANTERIOR CERVICAL DECOMP/DISCECTOMY FUSION  09/12/2012   Procedure: ANTERIOR CERVICAL DECOMPRESSION/DISCECTOMY FUSION 2 LEVELS;  Surgeon: Eldred Manges, MD;  Location: [facility] OR;  Service: Orthopedics;  Laterality: N/A;  C4-5, C5-6 Anterior Cervical Discectomy and Fusion, Bone Allograft, Plate  . SHOULDER SURGERY     right, 2005    There were no vitals filed for this visit.  Subjective Assessment - 09/02/18 0851    Subjective  Reports she still has times when she gets pain in R knee- it is always on the stairs or sometimes with jump squats. Has been avoiding jump squats.     Pertinent History  carpal tunnel syndrome R, HA, chronic neck pain, R shoulder surgery 2005, ACDF 2013    Diagnostic tests  07/16/18 R knee MRI: No meniscal or ligamentous injury of the right knee. Small focal chondromalacia and mild subchondral reactive marrow changes at the trochlear groove without a focal defect    Patient Stated Goals  "not feeling the pain anymore and not have my knee go  under me anymore"    Currently in Pain?  Yes    Pain Score  1     Pain Location  Knee    Pain Orientation  Right    Pain Descriptors / Indicators  Sharp    Pain Type  Chronic pain                       [facility] Adult PT Treatment/Exercise - 09/02/18 0001      Knee/Hip Exercises: Stretches   Quad Stretch  Right;Left;1 rep;30 seconds      Knee/Hip Exercises: Aerobic   Stationary Bike  L2 x 5 min    Elliptical  L2 x 1 min   unable to tolerate d/t R knee pain- discontinued     Knee/Hip Exercises: Standing   Terminal Knee Extension  Right;Strengthening;Theraband;15 reps;Left    Theraband Level (Terminal Knee Extension)  Level 4 (Blue)    Terminal Knee Extension Limitations  1 UE on chair for support    Lateral Step Up  Right;1 set;Left;Hand Hold: 2;Step Height: 4";Limitations;15 reps    Lateral Step Up Limitations  at counter top with B UEs    Forward Step Up  10 reps;Right;Hand Hold: 1;Step Height: 6"   reporting 1 episode of R knee shooting pain   Forward Step Up Limitations  1 UE support on counter    Functional Squat  1 set;15 reps    Functional Squat Limitations  TRX squat; cues for R wt shift and avoiding pushing into deep painful squat      Manual Therapy   Manual Therapy  Taping    Kinesiotex  Create Space      Kinesiotix   Create Space  B chondramalacia patellae pattern with 50% stretch               PT Short Term Goals - 08/24/18 0806      PT SHORT TERM GOAL #1   Title  Patient to be independent with initial HEP.    Time  3    Period  Weeks    Status  On-going        PT Long Term Goals - 08/24/18 [id number]      PT LONG TERM GOAL #1   Title  Patient to be independent with advanced HEP.    Time  6    Period  Weeks    Status  On-going      PT LONG TERM GOAL #2   Title  Patient to demonstrate good eccentric control when stair climbing down 13 steps without handrail.    Time  6    Period  Weeks    Status  On-going      PT LONG TERM GOAL  #3   Title  Patient to demonstrate no flexibility impairments in B HS or quads.    Time  6    Period  Weeks    Status  On-going      PT LONG TERM GOAL #4   Title  Patient to report return to normal excise routine without restrictions or pain.     Time  6    Period  Weeks    Status  On-going            Plan - 09/02/18 1212    Clinical Impression Statement  Patient arrived to session with no new complaints. Reports she would like to try KT tape for knees today. Worked on progressive LE strengthening, focusing on quad stability and alignment. Patient did report episode of sharp R knee pain with anterior step up/downs, reporting that this is the pain she typically feels with stairs. Better tolerance of lateral step downs on smaller step. Performed TRX squats with patient noting discomfort at about 60 degrees- advised not to perform deep squat, which was then better tolerated. Ended session with KT taping to B knees for pain relief. Patient educated on taping precautions, wear time, and removal. Reporting mild but instant relief.     Clinical Impairments Affecting Rehab Potential  carpal tunnel syndrome R, HA, chronic neck pain, R shoulder surgery 2005, ACDF 2013    PT Treatment/Interventions  ADLs/Self Care Home Management;Cryotherapy;Electrical Stimulation;Moist Heat;Ultrasound;DME Instruction;Gait training;Stair training;Functional mobility training;Therapeutic activities;Therapeutic exercise;Manual techniques;Orthotic Fit/Training;Patient/family education;Neuromuscular re-education;Balance training;Passive range of motion;Dry needling;Energy conservation;Splinting;Taping;Vasopneumatic Device    PT Next Visit Plan  assess response to tape    Consulted and Agree with Plan of Care  Patient       Patient will benefit from skilled therapeutic intervention in order to improve the following deficits and impairments:  Decreased activity tolerance, Decreased strength, Pain, Other (comment),  Postural dysfunction, Impaired flexibility, Difficulty walking  Visit Diagnosis: Chronic pain of right knee  Acute pain of left knee  Other symptoms and signs involving the musculoskeletal system     Problem List Patient Active  Problem List   Diagnosis Date Noted  . History of fusion of cervical spine 07/27/2018  . Chronic pain of right knee 07/27/2018  . HYPERLIPIDEMIA 12/23/2009  . TONSILLITIS, ACUTE 12/20/2009  . 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 ALLERGIC RHINITIS CAUSE UNSPECIFIED 09/28/2008  . OTITIS MEDIA, ACUTE, RIGHT 01/06/2008    Anette GuarneriYevgeniya Kovalenko, PT, DPT 09/02/18 12:16 PM   [facility] [address]  [address] [place], [place], [address] Phone: [phone]   Fax:  [phone]  Name: Carla [name] MRN: [id number] Date of Birth: 11/13/1970

## 2018-09-12 ENCOUNTER — Encounter: Payer: Self-pay | Admitting: Physical Therapy

## 2018-09-12 ENCOUNTER — Ambulatory Visit: Payer: 59 | Admitting: Physical Therapy

## 2018-09-12 DIAGNOSIS — M25562 Pain in left knee: Secondary | ICD-10-CM

## 2018-09-12 DIAGNOSIS — G8929 Other chronic pain: Secondary | ICD-10-CM

## 2018-09-12 DIAGNOSIS — R29898 Other symptoms and signs involving the musculoskeletal system: Secondary | ICD-10-CM

## 2018-09-12 DIAGNOSIS — M25561 Pain in right knee: Secondary | ICD-10-CM | POA: Diagnosis not present

## 2018-09-12 NOTE — Therapy (Addendum)
[facility] [address]  [place], [place], [address] Phone: [phone]   Fax:  [phone]  Physical Therapy Treatment  Patient Details  Name: Carla [name] MRN: [id number] Date of Birth: 1971/01/13 Referring Provider (PT): Rodell Perna, MD   Encounter Date: 09/12/2018  PT End of Session - 09/12/18 1021    Visit Number  5    Number of Visits  13    Date for PT Re-Evaluation  10/03/18    Authorization Type  [employer]    PT Start Time  [phone]   patient late   PT Stop Time  0930    PT Time Calculation (min)  36 min    Activity Tolerance  Patient tolerated treatment well    Behavior During Therapy  [facility] for tasks assessed/performed       Past Medical History:  Diagnosis Date  . Chronic neck pain   . Headache(784.0)    "migraines"  . Neuromuscular disorder ([place])    carpal tunnel right side    Past Surgical History:  Procedure Laterality Date  . ANTERIOR CERVICAL DECOMP/DISCECTOMY FUSION  09/12/2012   Procedure: ANTERIOR CERVICAL DECOMPRESSION/DISCECTOMY FUSION 2 LEVELS;  Surgeon: Marybelle Killings, MD;  Location: [facility];  Service: Orthopedics;  Laterality: N/A;  C4-5, C5-6 Anterior Cervical Discectomy and Fusion, Bone Allograft, Plate  . SHOULDER SURGERY     right, 2005    There were no vitals filed for this visit.  Subjective Assessment - 09/12/18 0854    Subjective  Reports the knees are feeling the same. Said she initially thought she noticed improvementfrom using tape, however still was having the catching pain when boxing with the tape on.    Pertinent History  carpal tunnel syndrome R, HA, chronic neck pain, R shoulder surgery 2005, ACDF 2013    Diagnostic tests  07/16/18 R knee MRI: No meniscal or ligamentous injury of the right knee. Small focal chondromalacia and mild subchondral reactive marrow changes at the trochlear groove without a focal defect    Patient Stated Goals  "not feeling the pain anymore and  not have my knee go under me anymore"    Currently in Pain?  No/denies                       [facility] Adult PT Treatment/Exercise - 09/12/18 0001      Exercises   Exercises  Knee/Hip      Knee/Hip Exercises: Stretches   Passive Hamstring Stretch  Right;2 reps;30 seconds    Passive Hamstring Stretch Limitations  strap     Hip Flexor Stretch  Right;30 seconds;2 reps    Hip Flexor Stretch Limitations  with strap       Knee/Hip Exercises: Aerobic   Stationary Bike  L3 x 41mn      Knee/Hip Exercises: Standing   Step Down  Right;15 reps;Hand Hold: 1;Step Height: 4";2 sets    Step Down Limitations  stepping L heel to floor behind    Functional Squat  1 set;10 reps    Functional Squat Limitations  TRX squat wiht green TB      Knee/Hip Exercises: Seated   Knee/Hip Flexion  long sitting SLR x10 each LE      Knee/Hip Exercises: Sidelying   Hip ABduction  Strengthening;Right;Left;1 set;10 reps    Hip ABduction Limitations  2#; pt reporting significant muscle burn; cues for proper alignment    Hip ADduction  Strengthening;Right;Left;1 set;10 reps  Hip ADduction Limitations  2#      Knee/Hip Exercises: Prone   Other Prone Exercises  plank on elbows/toe 2x30"   with cues for TKE            PT Education - 09/12/18 1021    Education Details  update to HEP    Person(s) Educated  Patient    Methods  Explanation;Demonstration;Tactile cues;Verbal cues;Handout    Comprehension  Verbalized understanding;Returned demonstration       PT Short Term Goals - 09/12/18 1025      PT SHORT TERM GOAL #1   Title  Patient to be independent with initial HEP.    Time  3    Period  Weeks    Status  Achieved        PT Long Term Goals - 08/24/18 3154      PT LONG TERM GOAL #1   Title  Patient to be independent with advanced HEP.    Time  6    Period  Weeks    Status  On-going      PT LONG TERM GOAL #2   Title  Patient to demonstrate good eccentric control when stair  climbing down 13 steps without handrail.    Time  6    Period  Weeks    Status  On-going      PT LONG TERM GOAL #3   Title  Patient to demonstrate no flexibility impairments in B HS or quads.    Time  6    Period  Weeks    Status  On-going      PT LONG TERM GOAL #4   Title  Patient to report return to normal excise routine without restrictions or pain.     Time  6    Period  Weeks    Status  On-going            Plan - 09/12/18 1022    Clinical Impression Statement  Patient arrived to session late with report of no changes in her knee pain. Noting that she is still getting catching pain with stairs. Patient says that she is adamant about staying conservative with treatments. Worked on LE strengthening and stretching today with no complaints of knee pain from patient. Focused on squats with banded resistance around knees and sidelying hip abduction and adduction to address hip strengthening. Patient reported considerable muscle burning with sidelying exercises. Required manual cues to avoid valgus collapse with step downs today on 4" step, unable to tolerate 6" step d/t quad weakness. Patient also with considerable ankle instability with this exercise. Ended session with no complaints.     Clinical Impairments Affecting Rehab Potential  carpal tunnel syndrome R, HA, chronic neck pain, R shoulder surgery 2005, ACDF 2013    PT Treatment/Interventions  ADLs/Self Care Home Management;Cryotherapy;Electrical Stimulation;Moist Heat;Ultrasound;DME Instruction;Gait training;Stair training;Functional mobility training;Therapeutic activities;Therapeutic exercise;Manual techniques;Orthotic Fit/Training;Patient/family education;Neuromuscular re-education;Balance training;Passive range of motion;Dry needling;Energy conservation;Splinting;Taping;Vasopneumatic Device    PT Next Visit Plan  progress single leg balance, step downs, hip strengthening     Consulted and Agree with Plan of Care  Patient        Patient will benefit from skilled therapeutic intervention in order to improve the following deficits and impairments:  Decreased activity tolerance, Decreased strength, Pain, Other (comment), Postural dysfunction, Impaired flexibility, Difficulty walking  Visit Diagnosis: Chronic pain of right knee  Acute pain of left knee  Other symptoms and signs involving the musculoskeletal system     Problem  List Patient Active Problem List   Diagnosis Date Noted  . History of fusion of cervical spine 07/27/2018  . Chronic pain of right knee 07/27/2018  . HYPERLIPIDEMIA 12/23/2009  . TONSILLITIS, ACUTE 12/20/2009  . 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 ALLERGIC RHINITIS CAUSE UNSPECIFIED 09/28/2008  . OTITIS MEDIA, ACUTE, RIGHT 01/06/2008     Janene Harvey, PT, DPT 09/12/18 10:26 AM   [facility] [address]  [place], [place], [address] Phone: [phone]   Fax:  [phone]  Name: Ember Henrikson MRN: [id number] Date of Birth: Apr 09, 1971  PHYSICAL THERAPY DISCHARGE SUMMARY  Visits from Start of Care: 5  Current functional level related to goals / functional outcomes: Unable to assess; patient did not return after last session   Remaining deficits: Unable to assess   Education / Equipment: HEP  Plan: Patient agrees to discharge.  Patient goals were not met. Patient is being discharged due to not returning since the last visit.  ?????     Janene Harvey, PT, DPT 10/11/18 1:13 PM

## 2018-09-13 ENCOUNTER — Ambulatory Visit: Payer: 59 | Admitting: Physical Therapy

## 2019-06-06 ENCOUNTER — Other Ambulatory Visit: Payer: Self-pay

## 2019-06-06 DIAGNOSIS — Z20822 Contact with and (suspected) exposure to covid-19: Secondary | ICD-10-CM

## 2019-06-07 LAB — NOVEL CORONAVIRUS, NAA: SARS-CoV-2, NAA: NOT DETECTED

## 2019-12-11 ENCOUNTER — Other Ambulatory Visit: Payer: Self-pay | Admitting: Obstetrics and Gynecology

## 2019-12-18 ENCOUNTER — Other Ambulatory Visit: Payer: Self-pay | Admitting: Obstetrics and Gynecology

## 2019-12-18 DIAGNOSIS — Z3009 Encounter for other general counseling and advice on contraception: Secondary | ICD-10-CM

## 2019-12-18 DIAGNOSIS — N979 Female infertility, unspecified: Secondary | ICD-10-CM

## 2021-06-17 ENCOUNTER — Other Ambulatory Visit: Payer: Self-pay

## 2021-06-17 ENCOUNTER — Ambulatory Visit (INDEPENDENT_AMBULATORY_CARE_PROVIDER_SITE_OTHER): Payer: No Typology Code available for payment source

## 2021-06-17 ENCOUNTER — Encounter: Payer: Self-pay | Admitting: Orthopedic Surgery

## 2021-06-17 ENCOUNTER — Ambulatory Visit (INDEPENDENT_AMBULATORY_CARE_PROVIDER_SITE_OTHER): Payer: No Typology Code available for payment source | Admitting: Orthopedic Surgery

## 2021-06-17 ENCOUNTER — Ambulatory Visit: Payer: 59 | Admitting: Orthopedic Surgery

## 2021-06-17 DIAGNOSIS — S6990XA Unspecified injury of unspecified wrist, hand and finger(s), initial encounter: Secondary | ICD-10-CM

## 2021-06-17 DIAGNOSIS — S6992XA Unspecified injury of left wrist, hand and finger(s), initial encounter: Secondary | ICD-10-CM

## 2021-06-17 NOTE — Progress Notes (Signed)
Office Visit Note   Patient: Carla [name]           Date of Birth: 10-17-1970           MRN: [id number] Visit Date: 06/17/2021              Requested by: [name], MD [address] [place],  [place] [address] PCP: [name], MD   Assessment & Plan: Visit Diagnoses:  1. Injury of little finger     Plan: Discussed with patient that she does not have any acute fractures on x-rays today.  It is likely that she jammed her finger with partial injury to her radial collateral ligament given there her swelling is mostly radial.  She has no radial or ulnar instability in extension or flexion which would indicate a complete collateral injury.  Her biggest concern is swelling of her SF PIP joint.  Discussed that swelling of that joint is common after injury and will likely take some time to resolve.  She was given Coban to help w/ swelling but warned about applying too tight and the need to work on ROM to prevent stiffness.   Follow-Up Instructions: No follow-ups on file.   Orders:  Orders Placed This Encounter  Procedures   XR Finger Little Left   No orders of the defined types were placed in this encounter.     Procedures: No procedures performed   Clinical Data: No additional findings.   Subjective: Chief Complaint  Patient presents with   Left Little Finger - Injury    This is a 50 yo RHD F who presents w/ left small finger pain at the PIP joint.  She fell exactly one month ago today w/ resulting pain in the joint.  She developed PIP joint swelling but thought it would have improved by now.  Her pain has improved and she now has pain only w/ terminal flexion at the PIP joint.  She has not been wearing any form of splint.  She has not needed any pain medication.  She denies previous injury to this hand.   Injury   Review of Systems  Constitutional: Negative.   Respiratory: Negative.    Cardiovascular: Negative.   Skin: Negative.    Neurological: Negative.     Objective: Vital Signs: There were no vitals taken for this visit.  Physical Exam Constitutional:      Appearance: Normal appearance.  Cardiovascular:     Rate and Rhythm: Normal rate.     Pulses: Normal pulses.  Pulmonary:     Effort: Pulmonary effort is normal.  Skin:    General: Skin is warm and dry.     Capillary Refill: Capillary refill takes less than 2 seconds.  Neurological:     Mental Status: She is alert.    Right Hand Exam   Tenderness  Right hand tenderness location: Mildly TTP at radial aspect of PIP joint w/ mild to moderate swelling.  Other  Erythema: absent Sensation: normal Pulse: present  Comments:  PIP ROM from 0-90 degrees w/ mild discomfort beyond 90 deg flexion.  No radial/ulnar instability tested at extension and 30 deg flexion.  Mild to moderate swelling at PIP joint and mostly radial.      Specialty Comments:  No specialty comments available.  Imaging: 3V of the left small finger taken today are reviewed and interpreted by me.  They demonstrate no acute fracture.  The PIP joint is concentrically reduced on all views.  PMFS History: Patient Active Problem List   Diagnosis Date Noted   History of fusion of cervical spine 07/27/2018   Chronic pain of right knee 07/27/2018   HYPERLIPIDEMIA 12/23/2009   TONSILLITIS, ACUTE 12/20/2009   CLUSTER HEADACHE SYNDROME UNSPECIFIED 09/28/2008   ALLERGIC RHINITIS CAUSE UNSPECIFIED 09/28/2008   OTITIS MEDIA, ACUTE, RIGHT 01/06/2008   Past Medical History:  Diagnosis Date   Chronic neck pain    Headache(784.0)    "migraines"   Neuromuscular disorder ([facility])    carpal tunnel right side    No family history on file.  Past Surgical History:  Procedure Laterality Date   ANTERIOR CERVICAL DECOMP/DISCECTOMY FUSION  09/12/2012   Procedure: ANTERIOR CERVICAL DECOMPRESSION/DISCECTOMY FUSION 2 LEVELS;  Surgeon: Eldred Manges, MD;  Location: [facility];  Service: Orthopedics;   Laterality: N/A;  C4-5, C5-6 Anterior Cervical Discectomy and Fusion, Bone Allograft, Plate   SHOULDER SURGERY     right, 2005   Social History   Occupational History   Not on file  Tobacco Use   Smoking status: Never   Smokeless tobacco: Never  Substance and Sexual Activity   Alcohol use: Not on file    Comment: "social"   Drug use: No   Sexual activity: Not on file

## 2023-05-03 ENCOUNTER — Emergency Department (HOSPITAL_COMMUNITY): Payer: No Typology Code available for payment source

## 2023-05-03 ENCOUNTER — Encounter (HOSPITAL_COMMUNITY): Payer: Self-pay

## 2023-05-03 ENCOUNTER — Emergency Department (HOSPITAL_COMMUNITY)
Admission: EM | Admit: 2023-05-03 | Discharge: 2023-05-03 | Disposition: A | Discharge status: Home / Self Care | Payer: No Typology Code available for payment source | Attending: Emergency Medicine | Admitting: Emergency Medicine

## 2023-05-03 ENCOUNTER — Other Ambulatory Visit: Payer: Self-pay

## 2023-05-03 DIAGNOSIS — R002 Palpitations: Secondary | ICD-10-CM

## 2023-05-03 DIAGNOSIS — R0789 Other chest pain: Secondary | ICD-10-CM | POA: Diagnosis not present

## 2023-05-03 LAB — CBC
HCT: 34.8 % — ABNORMAL LOW (ref 36.0–46.0)
Hemoglobin: 10.9 g/dL — ABNORMAL LOW (ref 12.0–15.0)
MCH: 26.4 pg (ref 26.0–34.0)
MCHC: 31.3 g/dL (ref 30.0–36.0)
MCV: 84.3 fL (ref 80.0–100.0)
Platelets: 290 10*3/uL (ref 150–400)
RBC: 4.13 MIL/uL (ref 3.87–5.11)
RDW: 14.5 % (ref 11.5–15.5)
WBC: 6.4 10*3/uL (ref 4.0–10.5)
nRBC: 0 % (ref 0.0–0.2)

## 2023-05-03 LAB — HEPATIC FUNCTION PANEL
ALT: 15 U/L (ref 0–44)
AST: 18 U/L (ref 15–41)
Albumin: 3.7 g/dL (ref 3.5–5.0)
Alkaline Phosphatase: 55 U/L (ref 38–126)
Bilirubin, Direct: <0.1 mg/dL (ref 0.0–0.2)
Total Bilirubin: 0.6 mg/dL (ref 0.3–1.2)
Total Protein: 6.9 g/dL (ref 6.5–8.1)

## 2023-05-03 LAB — BASIC METABOLIC PANEL
Anion gap: 11 (ref 5–15)
BUN: 11 mg/dL (ref 6–20)
CO2: 26 mmol/L (ref 22–32)
Calcium: 9.1 mg/dL (ref 8.9–10.3)
Chloride: 100 mmol/L (ref 98–111)
Creatinine, Ser: 0.73 mg/dL (ref 0.44–1.00)
GFR, Estimated: >60 mL/min (ref >60)
Glucose, Bld: 95 mg/dL (ref 70–99)
Potassium: 3.7 mmol/L (ref 3.5–5.1)
Sodium: 137 mmol/L (ref 135–145)

## 2023-05-03 LAB — TROPONIN I (HIGH SENSITIVITY)
Troponin I (High Sensitivity): <2 ng/L (ref <18)
Troponin I (High Sensitivity): 3 ng/L (ref <18)

## 2023-05-03 LAB — D-DIMER, QUANTITATIVE: D-Dimer, Quant: <0.27 ug{FEU}/mL (ref 0.00–0.50)

## 2023-05-03 LAB — HCG, SERUM, QUALITATIVE: Preg, Serum: NEGATIVE

## 2023-05-03 NOTE — Discharge Instructions (Signed)
It was a pleasure taking part in your care today.  As we discussed, your workup was largely reassuring.  We were not able to appreciate any obvious arrhythmias besides PVCs on your EKG.  We are referring you to cardiology.  We are sending you with an ambulatory referral.  Cardiologist office will call you in the next 7 days to schedule an appointment.  If they do not call, please call them utilizing number on this form.  Please return to the ED with any new or worsening symptoms such as loss of consciousness, lightheadedness, dizziness, weakness, shortness of breath or chest pain.  Please read attached guide concerning palpitations.

## 2023-05-03 NOTE — ED Triage Notes (Signed)
Pt states felt heart flutters on Friday. Pt states she listened to her heart with stethoscope and heard her heart irregularly beating. Pt c/o pain down left arm and in upper mid back started this morning. Pt denies N/V, SOB

## 2023-05-03 NOTE — ED Provider Notes (Signed)
MSE note.  Patient states that she noticed palpitations on Friday.  Incidentally she stated that she started [facility] Friday also.  Patient having no chest pain no shortness of breath physical exam labs unremarkable.  Patient alert and oriented to 4.  Lungs clear heart regular rate and rhythm.  Pulses normal.  Patient will get basic labs and a troponin and a chest x-ray.  EKG was done and shows 1 PVC.   Bethann Berkshire, MD 05/05/23 1148

## 2023-05-03 NOTE — ED Provider Notes (Signed)
[place] EMERGENCY DEPARTMENT AT [facility] Provider Note   CSN: [id number] Arrival date & time: 05/03/23  1042     History  Chief Complaint  Patient presents with   Chest Pain    Carla [name] is a 52 y.o. female with medical history of chronic neck pain, headache, neuromuscular disorder.  Patient presents to ED for evaluation of palpitations.  The patient reports that on Friday she began taking Wegovy for weight loss.  She states that that night around 10:30 PM she developed a chest "fluttering" as she describes it.  She reports that she had a sensation as if someone was "tapping on my chest".  She states that this occurred on Friday night, resolved Friday night.  She reports she woke up with his continued feeling of "fluttering" in her chest on Saturday.  She states that it persisted throughout Saturday and Sunday.  She reports that she woke up this morning and had pain in her posterior shoulder on the left side as well as the posterior part of her left upper arm. She states at 1 point she did grab her stethoscope, she states that she is a Engineer, civil (consulting), and listened to her heartbeat.  She reports that she felt as if she was hearing "missed beats".  She denies a history of arrhythmias.  She denies any chest pain that occurred with these episodes, shortness of breath, nausea, vomiting, back pain, lightheadedness, dizziness, weakness or syncope.  She reports that she went to urgent care this morning and was redirected to ED for cardiac workup.   Chest Pain Associated symptoms: palpitations   Associated symptoms: no dizziness, no fever, no headache, no nausea, no shortness of breath, no vomiting and no weakness        Home Medications Prior to Admission medications   Medication Sig Start Date End Date Taking? Authorizing Provider  tiZANidine (ZANAFLEX) 4 MG tablet Take 0.5-1 tablets (2-4 mg total) by mouth at bedtime. Patient not taking: Reported on 07/12/2018 04/27/18   Tyrell Antonio, MD      Allergies    Chlorhexidine gluconate    Review of Systems   Review of Systems  Constitutional:  Negative for fever.  Respiratory:  Negative for shortness of breath.   Cardiovascular:  Positive for palpitations. Negative for chest pain and leg swelling.  Gastrointestinal:  Negative for nausea and vomiting.  Neurological:  Negative for dizziness, tremors, syncope, weakness, light-headedness and headaches.  All other systems reviewed and are negative.   Physical Exam Updated Vital Signs BP 126/78   Pulse 87   Temp 98.1 F (36.7 C) (Oral)   Resp 17   Ht 5\' 4"  (1.626 m)   Wt 59.9 kg   SpO2 100%   BMI 22.67 kg/m  Physical Exam Vitals and nursing note reviewed.  Constitutional:      General: She is not in acute distress.    Appearance: She is not toxic-appearing.  HENT:     Head: Normocephalic and atraumatic.     Nose: Nose normal.     Mouth/Throat:     Mouth: Mucous membranes are moist.     Pharynx: Oropharynx is clear.  Eyes:     Extraocular Movements: Extraocular movements intact.     Conjunctiva/sclera: Conjunctivae normal.     Pupils: Pupils are equal, round, and reactive to light.  Cardiovascular:     Rate and Rhythm: Normal rate and regular rhythm.  Pulmonary:     Effort: Pulmonary effort is normal.  Breath sounds: Normal breath sounds. No wheezing.  Abdominal:     General: Abdomen is flat. Bowel sounds are normal.     Palpations: Abdomen is soft.     Tenderness: There is no abdominal tenderness.  Musculoskeletal:     Cervical back: Normal range of motion and neck supple. No tenderness.     Right lower leg: No edema.     Left lower leg: No edema.  Skin:    General: Skin is warm and dry.     Capillary Refill: Capillary refill takes less than 2 seconds.  Neurological:     General: No focal deficit present.     Mental Status: She is alert and oriented to person, place, and time.     GCS: GCS eye subscore is 4. GCS verbal subscore is 5.  GCS motor subscore is 6.     Cranial Nerves: Cranial nerves 2-12 are intact. No cranial nerve deficit.     Sensory: Sensation is intact. No sensory deficit.     Motor: Motor function is intact. No weakness.     Coordination: Coordination is intact. Heel to [facility] Test normal.     ED Results / Procedures / Treatments   Labs (all labs ordered are listed, but only abnormal results are displayed) Labs Reviewed  CBC - Abnormal; Notable for the following components:      Result Value   Hemoglobin 10.9 (*)    HCT 34.8 (*)    All other components within normal limits  BASIC METABOLIC PANEL  HCG, SERUM, QUALITATIVE  HEPATIC FUNCTION PANEL  D-DIMER, QUANTITATIVE  TROPONIN I (HIGH SENSITIVITY)  TROPONIN I (HIGH SENSITIVITY)    EKG EKG Interpretation Date/Time:  Monday May 03 2023 17:25:36 EDT Ventricular Rate:  68 PR Interval:  191 QRS Duration:  72 QT Interval:  379 QTC Calculation: 403 R Axis:   63  Text Interpretation: Sinus rhythm Confirmed by Fulton Reek [phone]) on 05/03/2023 5:28:39 PM  Radiology DG Chest 2 View  Result Date: 05/03/2023 CLINICAL DATA:  52 year old female with chest pain EXAM: CHEST - 2 VIEW COMPARISON:  12/30/2007 FINDINGS: Cardiomediastinal silhouette unchanged in size and contour. No evidence of central vascular congestion. No interlobular septal thickening. No pneumothorax or pleural effusion. Coarsened interstitial markings, with no confluent airspace disease. No acute displaced fracture. Degenerative changes of the spine. Surgical changes of the cervical region IMPRESSION: No active cardiopulmonary disease. Electronically Signed   By: Gilmer Mor D.O.   On: 05/03/2023 13:29    Procedures Procedures   Medications Ordered in ED Medications - No data to display  ED Course/ Medical Decision Making/ A&P  Medical Decision Making Amount and/or Complexity of Data Reviewed Labs: ordered. Radiology: ordered.   52 year old female presents to the ED  for evaluation.  Please see HPI for further details.  On examination the patient is afebrile, nontachycardic.  Her lung sounds are clear bilaterally and she is not hypoxic on room air.  Her abdomen is soft and compressible throughout.  Neurological examination at baseline without focal neurodeficits.  She is overall nontoxic in appearance.  Patient CBC shows no leukocytosis, baseline hemoglobin 10.9.  Metabolic panel shows no electrolyte derangement, no elevated LFTs on hepatic function panel.  Her creatinine send elevated, her anion gap is 11.  Her troponin is less than 2, 3 on repeat.  Her D-dimer is not elevated at less than 0.27.  Her EKG shows sinus rhythm with PVCs.  This could be source of patient feeling of palpitation.  Patient chest x-ray is unremarkable without consolidations or effusions.  At this time, patient could benefit from Holter monitor or stress test.  She denies any chest pain or shortness of breath that is occurred with any of these events.  We will put an ambulatory referral to cardiology in.  She was given strict return precautions and she has voiced understanding.  She had all of her questions answered to her satisfaction.  She is stable to discharge home at this time.   Final Clinical Impression(s) / ED Diagnoses Final diagnoses:  Palpitation    Rx / DC Orders ED Discharge Orders          Ordered    Ambulatory referral to Cardiology        05/03/23 2109              Clent Ridges 05/03/23 2111    Laurence Spates, MD 05/04/23 [phone]

## 2023-05-04 ENCOUNTER — Telehealth: Payer: Self-pay | Admitting: Internal Medicine

## 2023-05-04 NOTE — Telephone Encounter (Signed)
Patient called back and said that the ER told her he wants her to be seen between today and a week. I didn't see a urgent request in referral. Patient would like a call back. Got her scheduled to see Dr. Wyline Mood on 10/3 at 8:20

## 2023-05-04 NOTE — Telephone Encounter (Signed)
Patient is a new patient rescheduled her for 8/30 with Dr. Servando Salina

## 2023-05-10 ENCOUNTER — Ambulatory Visit: Payer: No Typology Code available for payment source | Attending: Cardiology | Admitting: Cardiology

## 2023-05-10 ENCOUNTER — Ambulatory Visit: Payer: No Typology Code available for payment source | Attending: Cardiology

## 2023-05-10 VITALS — BP 116/78 | HR 72 | Ht 64.5 in | Wt 151.2 lb

## 2023-05-10 DIAGNOSIS — R002 Palpitations: Secondary | ICD-10-CM | POA: Diagnosis not present

## 2023-05-10 DIAGNOSIS — I493 Ventricular premature depolarization: Secondary | ICD-10-CM

## 2023-05-10 NOTE — Patient Instructions (Signed)
Medication Instructions:  No changes *If you need a refill on your cardiac medications before your next appointment, please call your pharmacy*  Testing/Procedures: Your physician has requested that you have an echocardiogram. Echocardiography is a painless test that uses sound waves to create images of your heart. It provides your doctor with information about the size and shape of your heart and how well your heart's chambers and valves are working. This procedure takes approximately one hour. There are no restrictions for this procedure. Please do NOT wear cologne, perfume, aftershave, or lotions (deodorant is allowed). Please arrive 15 minutes prior to your appointment time.   Your physician has recommended that you wear a 14 DAY ZIO-PATCH monitor. The Zio patch cardiac monitor continuously records heart rhythm data for up to 14 days, this is for patients being evaluated for multiple types heart rhythms. For the first 24 hours post application, please avoid getting the Zio monitor wet in the shower or by excessive sweating during exercise. After that, feel free to carry on with regular activities. Keep soaps and lotions away from the ZIO XT Patch.  This will be mailed to you, please expect 7-10 days to receive.    Applying the monitor   Shave hair from upper left chest.   Hold abrader disc by orange tab.  Rub abrader in 40 strokes over left upper chest as indicated in your monitor instructions.   Clean area with 4 enclosed alcohol pads .  Use all pads to assure are is cleaned thoroughly.  Let dry.   Apply patch as indicated in monitor instructions.  Patch will be place under collarbone on left side of chest with arrow pointing upward.   Rub patch adhesive wings for 2 minutes.Remove white label marked "1".  Remove white label marked "2".  Rub patch adhesive wings for 2 additional minutes.   While looking in a mirror, press and release button in center of patch.  A small green light will  flash 3-4 times .  This will be your only indicator the monitor has been turned on.     Do not shower for the first 24 hours.  You may shower after the first 24 hours.   Press button if you feel a symptom. You will hear a small click.  Record Date, Time and Symptom in the Patient Log Book.   When you are ready to remove patch, follow instructions on last 2 pages of Patient Log Book.  Stick patch monitor onto last page of Patient Log Book.   Place Patient Log Book in [place] box.  Use locking tab on box and tape box closed securely.  The Orange and [employer] has [employer] on it.  Please place in mailbox as soon as possible.  Your physician should have your test results approximately 7 days after the monitor has been mailed back to [facility].   Call [facility] Customer Care at [phone] if you have questions regarding your ZIO XT patch monitor.  Call them immediately if you see an orange light blinking on your monitor.   If your monitor falls off in less than 4 days contact our Monitor department at [phone].  If your monitor becomes loose or falls off after 4 days call [employer] at [phone] for suggestions on securing your monitor    Follow-Up: At [facility], you and your health needs are our priority.  As part of our continuing mission to provide you with exceptional heart care, we have created designated Provider  Care Teams.  These Care Teams include your primary Cardiologist (physician) and Advanced Practice Providers (APPs -  Physician Assistants and Nurse Practitioners) who all work together to provide you with the care you need, when you need it.  We recommend signing up for the patient portal called "MyChart".  Sign up information is provided on this After Visit Summary.  MyChart is used to connect with patients for Virtual Visits (Telemedicine).  Patients are able to view lab/test results, encounter notes, upcoming appointments, etc.  Non-urgent messages  can be sent to your provider as well.   To learn more about what you can do with MyChart, go to [email or web address].    Your next appointment:   12 week(s)- Virtual Visit  Provider:   Dr Servando Salina

## 2023-05-10 NOTE — Progress Notes (Unsigned)
Enrolled for [employer] to mail a ZIO XT long term holter monitor to the patients address on file.  

## 2023-05-11 ENCOUNTER — Encounter: Payer: Self-pay | Admitting: Cardiology

## 2023-05-11 NOTE — Progress Notes (Signed)
Cardiology Office Note:    Date:  05/11/2023   ID:  Carla [name], DOB Jul 30, 1971, MRN [id number]  PCP:  [name], MD  Cardiologist:  Thomasene Ripple, DO  Electrophysiologist:  None   Referring MD: [name], MD     History of Present Illness:    Carla [name] is a 52 y.o. female with a hx of migraine headaches, carpal tunnel syndrome here today to be evaluated for intermittent palpitations. She reported experiencing fluttering. She reports that this continues to get progressively worse. She was seen in the ED at [facility]. During her stay in the ED she did have some PVC that were symptomatic. She has not passed out.   Past Medical History:  Diagnosis Date   Chronic neck pain    Headache(784.0)    "migraines"   Neuromuscular disorder (HCC)    carpal tunnel right side    Past Surgical History:  Procedure Laterality Date   ANTERIOR CERVICAL DECOMP/DISCECTOMY FUSION  09/12/2012   Procedure: ANTERIOR CERVICAL DECOMPRESSION/DISCECTOMY FUSION 2 LEVELS;  Surgeon: Eldred Manges, MD;  Location: [facility] OR;  Service: Orthopedics;  Laterality: N/A;  C4-5, C5-6 Anterior Cervical Discectomy and Fusion, Bone Allograft, Plate   SHOULDER SURGERY     right, 2005    Current Medications: No outpatient medications have been marked as taking for the 05/10/23 encounter (Office Visit) with Thomasene Ripple, DO.     Allergies:   Chlorhexidine gluconate   Social History   Socioeconomic History   Marital status: Single    Spouse name: Not on file   Number of children: Not on file   Years of education: Not on file   Highest education level: Not on file  Occupational History   Not on file  Tobacco Use   Smoking status: Never   Smokeless tobacco: Never  Substance and Sexual Activity   Alcohol use: Yes    Comment: "social"   Drug use: No   Sexual activity: Not on file  Other Topics Concern   Not on file  Social History Narrative   Not on file   Social Determinants  of Health   Financial Resource Strain: Not on file  Food Insecurity: Not on file  Transportation Needs: Not on file  Physical Activity: Not on file  Stress: Not on file  Social Connections: Not on file     Family History: The patient's family history is not on file.  ROS:   Review of Systems  Constitution: Negative for decreased appetite, fever and weight gain.  HENT: Negative for congestion, ear discharge, hoarse voice and sore throat.   Eyes: Negative for discharge, redness, vision loss in right eye and visual halos.  Cardiovascular: Negative for chest pain, dyspnea on exertion, leg swelling, orthopnea and palpitations.  Respiratory: Negative for cough, hemoptysis, shortness of breath and snoring.   Endocrine: Negative for heat intolerance and polyphagia.  Hematologic/Lymphatic: Negative for bleeding problem. Does not bruise/bleed easily.  Skin: Negative for flushing, nail changes, rash and suspicious lesions.  Musculoskeletal: Negative for arthritis, joint pain, muscle cramps, myalgias, neck pain and stiffness.  Gastrointestinal: Negative for abdominal pain, bowel incontinence, diarrhea and excessive appetite.  Genitourinary: Negative for decreased libido, genital sores and incomplete emptying.  Neurological: Negative for brief paralysis, focal weakness, headaches and loss of balance.  Psychiatric/Behavioral: Negative for altered mental status, depression and suicidal ideas.  Allergic/Immunologic: Negative for HIV exposure and persistent infections.    EKGs/Labs/Other Studies Reviewed:    The following studies  were reviewed today:   EKG:  The ekg ordered today demonstrates NSR HR 77 bpm  Recent Labs: 05/03/2023: ALT 15; BUN 11; Creatinine, Ser 0.73; Hemoglobin 10.9; Platelets 290; Potassium 3.7; Sodium 137  Recent Lipid Panel No results found for: "CHOL", "TRIG", "HDL", "CHOLHDL", "VLDL", "LDLCALC", "LDLDIRECT"  Physical Exam:    VS:  BP 116/78   Pulse 72   Ht 5' 4.5"  (1.638 m)   Wt 151 lb 3.2 oz (68.6 kg)   SpO2 100%   BMI 25.55 kg/m     Wt Readings from Last 3 Encounters:  05/10/23 151 lb 3.2 oz (68.6 kg)  05/03/23 132 lb 0.9 oz (59.9 kg)  07/27/18 132 lb (59.9 kg)     GEN: Well nourished, well developed in no acute distress HEENT: Normal NECK: No JVD; No carotid bruits LYMPHATICS: No lymphadenopathy CARDIAC: S1S2 noted,RRR, no murmurs, rubs, gallops RESPIRATORY:  Clear to auscultation without rales, wheezing or rhonchi  ABDOMEN: Soft, non-tender, non-distended, +bowel sounds, no guarding. EXTREMITIES: No edema, No cyanosis, no clubbing MUSCULOSKELETAL:  No deformity  SKIN: Warm and dry NEUROLOGIC:  Alert and oriented x 3, non-focal PSYCHIATRIC:  Normal affect, good insight  ASSESSMENT:    1. Palpitations   2. PVC (premature ventricular contraction)    PLAN:    Will get a zio monitor placed on the patient to assess her pVC burden. In addition an echo will be appropriate at this time to assess for any structural abnormalities.   The patient is in agreement with the above plan. The patient left the office in stable condition.  The patient will follow up in   Medication Adjustments/Labs and Tests Ordered: Current medicines are reviewed at length with the patient today.  Concerns regarding medicines are outlined above.  Orders Placed This Encounter  Procedures   LONG TERM MONITOR (3-14 DAYS)   EKG 12-Lead   EKG 12-Lead   ECHOCARDIOGRAM COMPLETE   No orders of the defined types were placed in this encounter.   Patient Instructions  Medication Instructions:  No changes *If you need a refill on your cardiac medications before your next appointment, please call your pharmacy*  Testing/Procedures: Your physician has requested that you have an echocardiogram. Echocardiography is a painless test that uses sound waves to create images of your heart. It provides your doctor with information about the size and shape of your heart and how  well your heart's chambers and valves are working. This procedure takes approximately one hour. There are no restrictions for this procedure. Please do NOT wear cologne, perfume, aftershave, or lotions (deodorant is allowed). Please arrive 15 minutes prior to your appointment time.   Your physician has recommended that you wear a 14 DAY ZIO-PATCH monitor. The Zio patch cardiac monitor continuously records heart rhythm data for up to 14 days, this is for patients being evaluated for multiple types heart rhythms. For the first 24 hours post application, please avoid getting the Zio monitor wet in the shower or by excessive sweating during exercise. After that, feel free to carry on with regular activities. Keep soaps and lotions away from the ZIO XT Patch.  This will be mailed to you, please expect 7-10 days to receive.    Applying the monitor   Shave hair from upper left chest.   Hold abrader disc by orange tab.  Rub abrader in 40 strokes over left upper chest as indicated in your monitor instructions.   Clean area with 4 enclosed alcohol pads .  Use  all pads to assure are is cleaned thoroughly.  Let dry.   Apply patch as indicated in monitor instructions.  Patch will be place under collarbone on left side of chest with arrow pointing upward.   Rub patch adhesive wings for 2 minutes.Remove white label marked "1".  Remove white label marked "2".  Rub patch adhesive wings for 2 additional minutes.   While looking in a mirror, press and release button in center of patch.  A small green light will flash 3-4 times .  This will be your only indicator the monitor has been turned on.     Do not shower for the first 24 hours.  You may shower after the first 24 hours.   Press button if you feel a symptom. You will hear a small click.  Record Date, Time and Symptom in the Patient Log Book.   When you are ready to remove patch, follow instructions on last 2 pages of Patient Log Book.  Stick patch monitor  onto last page of Patient Log Book.   Place Patient Log Book in [place] box.  Use locking tab on box and tape box closed securely.  The [employer] and [employer] has [employer] on it.  Please place in mailbox as soon as possible.  Your physician should have your test results approximately 7 days after the monitor has been mailed back to [facility].   Call [facility] Customer Care at [phone] if you have questions regarding your ZIO XT patch monitor.  Call them immediately if you see an orange light blinking on your monitor.   If your monitor falls off in less than 4 days contact our Monitor department at [phone].  If your monitor becomes loose or falls off after 4 days call [employer] at [phone] for suggestions on securing your monitor    Follow-Up: At [facility], you and your health needs are our priority.  As part of our continuing mission to provide you with exceptional heart care, we have created designated Provider Care Teams.  These Care Teams include your primary Cardiologist (physician) and Advanced Practice Providers (APPs -  Physician Assistants and Nurse Practitioners) who all work together to provide you with the care you need, when you need it.  We recommend signing up for the patient portal called "MyChart".  Sign up information is provided on this After Visit Summary.  MyChart is used to connect with patients for Virtual Visits (Telemedicine).  Patients are able to view lab/test results, encounter notes, upcoming appointments, etc.  Non-urgent messages can be sent to your provider as well.   To learn more about what you can do with MyChart, go to [email or web address].    Your next appointment:   12 week(s)- Virtual Visit  Provider:   Dr Servando Salina     Adopting a Healthy Lifestyle.  Know what a healthy weight is for you (roughly BMI <25) and aim to maintain this   Aim for 7+ servings of fruits and vegetables daily   65-80+ fluid ounces of water or  unsweet tea for healthy kidneys   Limit to max 1 drink of alcohol per day; avoid smoking/tobacco   Limit animal fats in diet for cholesterol and heart health - choose grass fed whenever available   Avoid highly processed foods, and foods high in saturated/trans fats   Aim for low stress - take time to unwind and care for your mental health   Aim for 150 min of moderate intensity exercise weekly for  heart health, and weights twice weekly for bone health   Aim for 7-9 hours of sleep daily   When it comes to diets, agreement about the perfect plan isnt easy to find, even among the experts. Experts at the [facility] [employer] developed an idea known as the Healthy Eating Plate. Just imagine a plate divided into logical, healthy portions.   The emphasis is on diet quality:   Load up on vegetables and fruits - one-half of your plate: Aim for color and variety, and remember that potatoes dont count.   Go for whole grains - one-quarter of your plate: Whole wheat, barley, wheat berries, quinoa, oats, brown rice, and foods made with them. If you want pasta, go with whole wheat pasta.   Protein power - one-quarter of your plate: Fish, chicken, beans, and nuts are all healthy, versatile protein sources. Limit red meat.   The diet, however, does go beyond the plate, offering a few other suggestions.   Use healthy plant oils, such as olive, canola, soy, corn, sunflower and peanut. Check the labels, and avoid partially hydrogenated oil, which have unhealthy trans fats.   If youre thirsty, drink water. Coffee and tea are good in moderation, but skip sugary drinks and limit milk and dairy products to one or two daily servings.   The type of carbohydrate in the diet is more important than the amount. Some sources of carbohydrates, such as vegetables, fruits, whole grains, and beans-are healthier than others.   Finally, stay active  Osvaldo Shipper, DO  05/11/2023 9:52 PM    [facility]

## 2023-05-13 DIAGNOSIS — I493 Ventricular premature depolarization: Secondary | ICD-10-CM

## 2023-05-14 ENCOUNTER — Ambulatory Visit: Payer: No Typology Code available for payment source | Admitting: Cardiology

## 2023-05-24 ENCOUNTER — Ambulatory Visit (HOSPITAL_COMMUNITY): Payer: No Typology Code available for payment source | Attending: Internal Medicine

## 2023-05-24 DIAGNOSIS — I361 Nonrheumatic tricuspid (valve) insufficiency: Secondary | ICD-10-CM | POA: Diagnosis not present

## 2023-05-24 DIAGNOSIS — I493 Ventricular premature depolarization: Secondary | ICD-10-CM | POA: Diagnosis present

## 2023-05-24 LAB — ECHOCARDIOGRAM COMPLETE
Area-P 1/2: 3.8 cm2
S' Lateral: 2.5 cm

## 2023-06-17 ENCOUNTER — Ambulatory Visit: Payer: No Typology Code available for payment source | Admitting: Internal Medicine

## 2023-06-23 ENCOUNTER — Telehealth: Payer: Self-pay | Admitting: Cardiology

## 2023-06-23 NOTE — Telephone Encounter (Signed)
Patient called to return RN Jasmine's call regarding a MyChart visit

## 2023-06-25 NOTE — Telephone Encounter (Signed)
Patient returning RN's call regarding virtual visit.

## 2023-06-25 NOTE — Telephone Encounter (Signed)
Left message for patient to return the call.

## 2023-06-30 NOTE — Telephone Encounter (Signed)
Patient is returning call and is requesting call back.  

## 2023-06-30 NOTE — Telephone Encounter (Signed)
Called pt. Appt made with Dr. Servando Salina.

## 2023-07-01 ENCOUNTER — Ambulatory Visit: Payer: No Typology Code available for payment source | Attending: Cardiology | Admitting: Cardiology

## 2023-07-01 ENCOUNTER — Encounter: Payer: Self-pay | Admitting: Cardiology

## 2023-07-01 VITALS — Ht 65.0 in | Wt 145.0 lb

## 2023-07-01 DIAGNOSIS — I493 Ventricular premature depolarization: Secondary | ICD-10-CM

## 2023-07-01 DIAGNOSIS — I471 Supraventricular tachycardia, unspecified: Secondary | ICD-10-CM

## 2023-07-01 MED ORDER — METOPROLOL SUCCINATE ER 25 MG PO TB24: 12.5000 mg | ORAL_TABLET | Freq: Every day | ORAL | 3 refills | Status: AC

## 2023-07-01 NOTE — Patient Instructions (Signed)
Medication Instructions:  START metoprolol succinate (Toprol XL) 12.5mg  daily -- half of a 25mg  tablet   *If you need a refill on your cardiac medications before your next appointment, please call your pharmacy*   Follow-Up: At [facility], you and your health needs are our priority.  As part of our continuing mission to provide you with exceptional heart care, we have created designated Provider Care Teams.  These Care Teams include your primary Cardiologist (physician) and Advanced Practice Providers (APPs -  Physician Assistants and Nurse Practitioners) who all work together to provide you with the care you need, when you need it.  We recommend signing up for the patient portal called "MyChart".  Sign up information is provided on this After Visit Summary.  MyChart is used to connect with patients for Virtual Visits (Telemedicine).  Patients are able to view lab/test results, encounter notes, upcoming appointments, etc.  Non-urgent messages can be sent to your provider as well.   To learn more about what you can do with MyChart, go to [email or web address].    Your next appointment:    12 weeks with Dr. Servando Salina

## 2023-07-02 NOTE — Progress Notes (Signed)
Virtual Visit via Video Note   Because of Carla [name]'s co-morbid illnesses, she is at least at moderate risk for complications without adequate follow up.  This format is felt to be most appropriate for this patient at this time.  All issues noted in this document were discussed and addressed.  A limited physical exam was performed with this format.  Please refer to the patient's chart for her consent to telehealth for [facility].      Date:  07/02/2023   ID:  Carla [name], DOB 11/19/1970, MRN [id number]  Patient Location: Home Provider Location: Office/Clinic  Virtual Visit via Video  Note . I connected with the patient today by a   video enabled telemedicine application and verified that I am speaking with the correct person using two identifiers.   PCP:  [name], MD  Cardiologist:  Thomasene Ripple, DO  Electrophysiologist:  None   Evaluation Performed:  Follow-Up Visit  Chief Complaint:  " I am doing well"  History of Present Illness:    Carla [name] is a 52 y.o. female with hx of migraine headaches, carpal tunnel syndrome, symptomatic PVC, and paroxysmal supraventricular tachycardia.  Recent Holter monitor revealed 810 episodes of SVT. The patient was asymptomatic during these episodes. The patient's symptoms include 'skip beats' and she has been experiencing these symptoms for an unspecified period of time.  The patient denies daytime sleepiness and snoring, which could suggest sleep apnea. However, the patient acknowledges feeling tired during the day.  The patient does not have symptoms concerning for COVID-19 infection (fever, chills, cough, or new shortness of breath).    Past Medical History:  Diagnosis Date   Chronic neck pain    Headache(784.0)    "migraines"   Neuromuscular disorder (HCC)    carpal tunnel right side   Past Surgical History:  Procedure Laterality Date   ANTERIOR CERVICAL DECOMP/DISCECTOMY FUSION  09/12/2012    Procedure: ANTERIOR CERVICAL DECOMPRESSION/DISCECTOMY FUSION 2 LEVELS;  Surgeon: Eldred Manges, MD;  Location: [facility] OR;  Service: Orthopedics;  Laterality: N/A;  C4-5, C5-6 Anterior Cervical Discectomy and Fusion, Bone Allograft, Plate   SHOULDER SURGERY     right, 2005     Current Meds  Medication Sig   metoprolol succinate (TOPROL XL) 25 MG 24 hr tablet Take 0.5 tablets (12.5 mg total) by mouth daily.     Allergies:   Chlorhexidine gluconate   Social History   Tobacco Use   Smoking status: Never   Smokeless tobacco: Never  Substance Use Topics   Alcohol use: Yes    Comment: "social"   Drug use: No     Family Hx: The patient's family history is not on file.  ROS:   Please see the history of present illness.     All other systems reviewed and are negative.   Prior CV studies:   The following studies were reviewed today:  Reviewed echo and zio  Labs/Other Tests and Data Reviewed:    EKG:  No ECG reviewed.  Recent Labs: 05/03/2023: ALT 15; BUN 11; Creatinine, Ser 0.73; Hemoglobin 10.9; Platelets 290; Potassium 3.7; Sodium 137   Recent Lipid Panel No results found for: "CHOL", "TRIG", "HDL", "CHOLHDL", "LDLCALC", "LDLDIRECT"  Wt Readings from Last 3 Encounters:  07/01/23 145 lb (65.8 kg)  05/10/23 151 lb 3.2 oz (68.6 kg)  05/03/23 132 lb 0.9 oz (59.9 kg)     Objective:    Vital Signs:  Ht 5\' 5"  (1.651 m)  Wt 145 lb (65.8 kg)   BMI 24.13 kg/m     ASSESSMENT & PLAN:    Supraventricular Tachycardia (SVT) High frequency of SVT episodes (810) noted on recent monitoring. Discussed the risk of tachycardia mediated cardiomyopathy with untreated SVT and the need for treatment to reduce the frequency of SVT episodes. -Start Metoprolol Succinate (Toprol XL) 12.5mg  at bedtime. -Follow-up in 12 weeks to assess tolerance and efficacy of the medication.  Potential Sleep Apnea Discussed the possibility of sleep apnea given daytime tiredness and snoring. Plan to further  evaluate this at the next office visit. -Discuss need for home sleep study at next office visit in 12 weeks.  COVID-19 Education: The signs and symptoms of COVID-19 were discussed with the patient and how to seek care for testing (follow up with PCP or arrange E-visit).  The importance of social distancing was discussed today.  Time:   Today, I have spent 15 minutes with the patient with telehealth technology discussing the above problems.     Medication Adjustments/Labs and Tests Ordered: Current medicines are reviewed at length with the patient today.  Concerns regarding medicines are outlined above.   Tests Ordered: No orders of the defined types were placed in this encounter.   Medication Changes: Meds ordered this encounter  Medications   metoprolol succinate (TOPROL XL) 25 MG 24 hr tablet    Sig: Take 0.5 tablets (12.5 mg total) by mouth daily.    Dispense:  45 tablet    Refill:  3    Follow Up:  In Person in 12 week(s)  Signed, Thomasene Ripple, DO  07/02/2023 11:06 PM    [facility]

## 2023-08-04 ENCOUNTER — Ambulatory Visit: Payer: No Typology Code available for payment source | Admitting: Cardiology

## 2023-09-24 ENCOUNTER — Encounter: Payer: Self-pay | Admitting: Cardiology

## 2023-09-24 ENCOUNTER — Ambulatory Visit: Payer: No Typology Code available for payment source | Attending: Cardiology | Admitting: Cardiology

## 2023-09-24 VITALS — BP 102/70 | HR 70 | Ht 64.0 in | Wt 146.2 lb

## 2023-09-24 DIAGNOSIS — I471 Supraventricular tachycardia, unspecified: Secondary | ICD-10-CM

## 2023-09-24 DIAGNOSIS — R400 Somnolence: Secondary | ICD-10-CM

## 2023-09-24 DIAGNOSIS — I493 Ventricular premature depolarization: Secondary | ICD-10-CM | POA: Diagnosis not present

## 2023-09-24 NOTE — Progress Notes (Signed)
 Patient agreement reviewed and signed on 09/24/2023.  WatchPAT issued to patient on 09/24/2023 by Rolin Qualia, RN. Patient aware to not open the WatchPAT box until contacted with the activation PIN. Patient profile initialized in CloudPAT on 09/24/2023 by Rolin Qualia, RN. Device serial number: [id number]

## 2023-09-24 NOTE — Patient Instructions (Signed)
 Medication Instructions:  Your physician recommends that you continue on your current medications as directed. Please refer to the Current Medication list given to you today.  *If you need a refill on your cardiac medications before your next appointment, please call your pharmacy*  Testing/Procedures: WatchPAT? is an FDA-cleared portable home sleep study test that uses a watch and 3 points of contact to monitor 7 different channels, including your heart rate, oxygen saturation, body position, snoring, and chest motion.  The study is easy to use from the comfort of your own home and accurately detect sleep apnea.  Before bed, you attach the chest sensor, attached the sleep apnea bracelet to your nondominant hand, and attach the finger probe.  After the study, the raw data is downloaded from the watch and scored for apnea events.   For more information: [email or web address]  Patient Testing Instructions:  Once our office has received insurance approval for you to complete this test, we will contact you with a PIN to activate the device.  This typically takes 2-3 weeks.  Please do not open the box until approved.   Do not put battery into the device until bedtime when you are ready to begin the test. Please call the support number if you need assistance after following the instructions below: 24 hour support line- [phone] or [facility] support at [phone] (option 2)  Download the [facility] WatchPAT One app through the [employer] or [employer]. Be sure to turn on or enable access to Bluetooth in settings on your smartphone. Make sure no other Bluetooth devices are on and within the vicinity of your smartphone and WatchPAT watch during testing.  Make sure to leave your smart phone plugged in and charging all night.  When ready for bed:  Follow the instructions step by step in the WatchPAT One app to activate the testing device. For additional instructions, including  video instruction, visit the WatchPAT One video on Youtube. You can search for WatchPAT One within Youtube (video is 4 minutes and 18 seconds) or enter: [email or web address]://youtube/[email or web address]_[email or web address] Please note: You will be prompted to enter a PIN to connect via Bluetooth when starting the test. The PIN will be assigned to you after insurance has approved the test.  The device is disposable, but it recommended that you retain the device until you receive a call letting you know the study has been received and the results have been interpreted.  We will let you know if the study did not transmit to us  properly after the test is completed. You do not need to call us  to confirm the receipt of the test.  Please complete the test within 48 hours of receiving PIN.   Frequently Asked Questions:  What is Watch PAT One?  A single use, fully disposable home sleep apnea testing device and will not need to be returned after completion.  What are the requirements to use WatchPAT One?  A successful WatchPAT One sleep study requires a WatchPAT One device, your smart phone, WatchPAT One app, your PIN number, and internet access. What type of phone do I need?  You should have a smart phone that uses Android 5.1 and above or any iPhone with IOS 10 and above. How can I download the WatchPAT one app?  Based on your device type search for WatchPAT One app either in [employer] for [employer] or [employer] for [employer]. Where will I get my PIN for the study?  Your PIN will be  provided by your physician's office after insurance has approved the test. This process typically takes 2-3 weeks. It is used for authentication and if you lose/forget your PIN, please reach out to your provider's office.  I do not have internet at home. Can I still complete a WatchPAT One study?  WatchPAT One needs internet connection throughout the night to be able to transmit the sleep data. You can use your home/local internet or your  cellular data package. However, it is always recommended to use home/local internet. It is estimated that between 20MB-30MB of data will be used with each study, but the application will be looking for space in the phone to start the study.  What happens if I lose internet or Bluetooth connection?  During the internet disconnection, your phone will not be able to transmit the sleep data.  All the data, will be stored in your phone.  As soon as the internet connection is back on, the phone will resume sending the sleep data. During the Bluetooth disconnection, WatchPAT One will not be able to to send the sleep data to your phone.  Data will be kept in the WatchPAT One until both devices have Bluetooth connection back on.  As soon as the connection is back on, WatchPAT one will send the sleep data to the phone.  How long do I need to wear the WatchPAT one?  After you start the study, you should wear the device at least 6 hours.  How far should I keep my phone from the device?  During the night, your phone should remain within 15 feet of where you sleep.  What happens if I leave the room for restroom or other reasons?  Leaving the room for any reason will not cause any problem. As soon as your get back to the room, both devices will reconnect and will continue to send the sleep data. Can I use my phone during the sleep study?  Yes, you can use your phone as usual during the study. But it is recommended to put your WatchPAT One on when you are ready to go to bed.  How will I get my study results?  A soon as you completed your study, your sleep data will be sent to the provider. They will then share the results with you when they are ready.   Follow-Up: At [facility], you and your health needs are our priority.  As part of our continuing mission to provide you with exceptional heart care, we have created designated Provider Care Teams.  These Care Teams include your primary Cardiologist  (physician) and Advanced Practice Providers (APPs -  Physician Assistants and Nurse Practitioners) who all work together to provide you with the care you need, when you need it.  Your next appointment:   9 month(s)  Provider:   Kardie Tobb, DO     Other Instructions:

## 2023-09-24 NOTE — Progress Notes (Signed)
 Cardiology Office Note:    Date:  09/24/2023   ID:  Carla [name], DOB 07-20-1971, MRN [id number]  PCP:  Rutherford Gain, MD  Cardiologist:  Dub Huntsman, DO  Electrophysiologist:  None   Referring MD: Rutherford Gain, MD    I am    History of Present Illness:    Carla [name] is a 53 y.o. female with a hx of hx of migraine headaches, carpal tunnel syndrome, symptomatic PVC, and paroxysmal supraventricular tachycardia.   I saw the patient virtually in October 2024 to discuss her monitor results - discussed the episodes of SVT. She was agreeable to start low dose toprol  xl. She tells me that there has been improvement with her symptoms since starting the medication.   At her last visit we discussed the need for sleep apnea testing set up when at an inperson visit. Noted fatigue.  Past Medical History:  Diagnosis Date   Chronic neck pain    Headache(784.0)    migraines   Neuromuscular disorder (HCC)    carpal tunnel right side    Past Surgical History:  Procedure Laterality Date   ANTERIOR CERVICAL DECOMP/DISCECTOMY FUSION  09/12/2012   Procedure: ANTERIOR CERVICAL DECOMPRESSION/DISCECTOMY FUSION 2 LEVELS;  Surgeon: Oneil JAYSON Herald, MD;  Location: [facility] OR;  Service: Orthopedics;  Laterality: N/A;  C4-5, C5-6 Anterior Cervical Discectomy and Fusion, Bone Allograft, Plate   SHOULDER SURGERY     right, 2005    Current Medications: Current Meds  Medication Sig   Azelaic Acid 15 % gel Apply topically 2 (two) times daily.   hydroquinone 4 % cream Apply topically as needed.   IRON SUPPLEMENT 220 (44 Fe) MG/5ML SOLN Take 10 mLs by mouth 2 (two) times daily.   metoprolol  succinate (TOPROL  XL) 25 MG 24 hr tablet Take 0.5 tablets (12.5 mg total) by mouth daily.   tretinoin (RETIN-A) 0.05 % cream Apply topically at bedtime.     Allergies:   Chlorhexidine gluconate   Social History   Socioeconomic History   Marital status: Single    Spouse name: Not on file   Number  of children: Not on file   Years of education: Not on file   Highest education level: Not on file  Occupational History   Not on file  Tobacco Use   Smoking status: Never   Smokeless tobacco: Never  Substance and Sexual Activity   Alcohol use: Yes    Comment: social   Drug use: No   Sexual activity: Not on file  Other Topics Concern   Not on file  Social History Narrative   Not on file   Social Drivers of Health   Financial Resource Strain: Not on file  Food Insecurity: Not on file  Transportation Needs: Not on file  Physical Activity: Not on file  Stress: Not on file  Social Connections: Not on file     Family History: The patient's family history is not on file.  ROS:   Review of Systems  Constitution: Negative for decreased appetite, fever and weight gain.  HENT: Negative for congestion, ear discharge, hoarse voice and sore throat.   Eyes: Negative for discharge, redness, vision loss in right eye and visual halos.  Cardiovascular: Negative for chest pain, dyspnea on exertion, leg swelling, orthopnea and palpitations.  Respiratory: Negative for cough, hemoptysis, shortness of breath and snoring.   Endocrine: Negative for heat intolerance and polyphagia.  Hematologic/Lymphatic: Negative for bleeding problem. Does not bruise/bleed easily.  Skin: Negative for flushing,  nail changes, rash and suspicious lesions.  Musculoskeletal: Negative for arthritis, joint pain, muscle cramps, myalgias, neck pain and stiffness.  Gastrointestinal: Negative for abdominal pain, bowel incontinence, diarrhea and excessive appetite.  Genitourinary: Negative for decreased libido, genital sores and incomplete emptying.  Neurological: Negative for brief paralysis, focal weakness, headaches and loss of balance.  Psychiatric/Behavioral: Negative for altered mental status, depression and suicidal ideas.  Allergic/Immunologic: Negative for HIV exposure and persistent infections.     EKGs/Labs/Other Studies Reviewed:    The following studies were reviewed today:   EKG:  The ekg ordered today demonstrates   Recent Labs: 05/03/2023: ALT 15; BUN 11; Creatinine, Ser 0.73; Hemoglobin 10.9; Platelets 290; Potassium 3.7; Sodium 137  Recent Lipid Panel No results found for: CHOL, TRIG, HDL, CHOLHDL, VLDL, LDLCALC, LDLDIRECT  Physical Exam:    VS:  BP 102/70 (BP Location: Right Arm, Patient Position: Sitting, Cuff Size: Normal)   Pulse 70   Ht 5' 4 (1.626 m)   Wt 146 lb 3.2 oz (66.3 kg)   SpO2 98%   BMI 25.10 kg/m     Wt Readings from Last 3 Encounters:  09/24/23 146 lb 3.2 oz (66.3 kg)  07/01/23 145 lb (65.8 kg)  05/10/23 151 lb 3.2 oz (68.6 kg)     GEN: Well nourished, well developed in no acute distress HEENT: Normal NECK: No JVD; No carotid bruits LYMPHATICS: No lymphadenopathy CARDIAC: S1S2 noted,RRR, no murmurs, rubs, gallops RESPIRATORY:  Clear to auscultation without rales, wheezing or rhonchi  ABDOMEN: Soft, non-tender, non-distended, +bowel sounds, no guarding. EXTREMITIES: No edema, No cyanosis, no clubbing MUSCULOSKELETAL:  No deformity  SKIN: Warm and dry NEUROLOGIC:  Alert and oriented x 3, non-focal PSYCHIATRIC:  Normal affect, good insight  ASSESSMENT:    1. Daytime somnolence   2. PSVT (paroxysmal supraventricular tachycardia) (HCC)   3. Symptomatic PVCs    PLAN:     Will continue current dose of toprol  xl.  Will set her up with the [facility] sleep study - STOPBANG score 3 - suggest intermediate risk for sleep apnea.    The patient is in agreement with the above plan. The patient left the office in stable condition.  The patient will follow up in   Medication Adjustments/Labs and Tests Ordered: Current medicines are reviewed at length with the patient today.  Concerns regarding medicines are outlined above.  Orders Placed This Encounter  Procedures   [facility] Sleep Study   No orders of the defined types were  placed in this encounter.   Patient Instructions  Medication Instructions:  Your physician recommends that you continue on your current medications as directed. Please refer to the Current Medication list given to you today.  *If you need a refill on your cardiac medications before your next appointment, please call your pharmacy*  Testing/Procedures: WatchPAT? is an FDA-cleared portable home sleep study test that uses a watch and 3 points of contact to monitor 7 different channels, including your heart rate, oxygen saturation, body position, snoring, and chest motion.  The study is easy to use from the comfort of your own home and accurately detect sleep apnea.  Before bed, you attach the chest sensor, attached the sleep apnea bracelet to your nondominant hand, and attach the finger probe.  After the study, the raw data is downloaded from the watch and scored for apnea events.   For more information: [email or web address]  Patient Testing Instructions:  Once our office has received insurance approval for you to complete this test, we  will contact you with a PIN to activate the device.  This typically takes 2-3 weeks.  Please do not open the box until approved.   Do not put battery into the device until bedtime when you are ready to begin the test. Please call the support number if you need assistance after following the instructions below: 24 hour support line- [phone] or IT[facility]AR support at [phone] (option 2)  Download the [facility] WatchPAT One app through the [employer] or [employer]. Be sure to turn on or enable access to Bluetooth in settings on your smartphone. Make sure no other Bluetooth devices are on and within the vicinity of your smartphone and WatchPAT watch during testing.  Make sure to leave your smart phone plugged in and charging all night.  When ready for bed:  Follow the instructions step by step in the WatchPAT One app to activate the  testing device. For additional instructions, including video instruction, visit the WatchPAT One video on Youtube. You can search for WatchPAT One within Youtube (video is 4 minutes and 18 seconds) or enter: [email or web address]://[email or web address] Please note: You will be prompted to enter a PIN to connect via Bluetooth when starting the test. The PIN will be assigned to you after insurance has approved the test.  The device is disposable, but it recommended that you retain the device until you receive a call letting you know the study has been received and the results have been interpreted.  We will let you know if the study did not transmit to us  properly after the test is completed. You do not need to call us  to confirm the receipt of the test.  Please complete the test within 48 hours of receiving PIN.   Frequently Asked Questions:  What is Watch PAT One?  A single use, fully disposable home sleep apnea testing device and will not need to be returned after completion.  What are the requirements to use WatchPAT One?  A successful WatchPAT One sleep study requires a WatchPAT One device, your smart phone, WatchPAT One app, your PIN number, and internet access. What type of phone do I need?  You should have a smart phone that uses Android 5.1 and above or any iPhone with IOS 10 and above. How can I download the WatchPAT one app?  Based on your device type search for WatchPAT One app either in [employer] for [employer] or [employer] for [employer]. Where will I get my PIN for the study?  Your PIN will be provided by your physician's office after insurance has approved the test. This process typically takes 2-3 weeks. It is used for authentication and if you lose/forget your PIN, please reach out to your provider's office.  I do not have internet at home. Can I still complete a WatchPAT One study?  WatchPAT One needs internet connection throughout the night to be able to transmit the sleep  data. You can use your home/local internet or your cellular data package. However, it is always recommended to use home/local internet. It is estimated that between 20MB-30MB of data will be used with each study, but the application will be looking for space in the phone to start the study.  What happens if I lose internet or Bluetooth connection?  During the internet disconnection, your phone will not be able to transmit the sleep data.  All the data, will be stored in your phone.  As soon as the internet connection is back  on, the phone will resume sending the sleep data. During the Bluetooth disconnection, WatchPAT One will not be able to to send the sleep data to your phone.  Data will be kept in the WatchPAT One until both devices have Bluetooth connection back on.  As soon as the connection is back on, WatchPAT one will send the sleep data to the phone.  How long do I need to wear the WatchPAT one?  After you start the study, you should wear the device at least 6 hours.  How far should I keep my phone from the device?  During the night, your phone should remain within 15 feet of where you sleep.  What happens if I leave the room for restroom or other reasons?  Leaving the room for any reason will not cause any problem. As soon as your get back to the room, both devices will reconnect and will continue to send the sleep data. Can I use my phone during the sleep study?  Yes, you can use your phone as usual during the study. But it is recommended to put your WatchPAT One on when you are ready to go to bed.  How will I get my study results?  A soon as you completed your study, your sleep data will be sent to the provider. They will then share the results with you when they are ready.   Follow-Up: At [facility], you and your health needs are our priority.  As part of our continuing mission to provide you with exceptional heart care, we have created designated Provider Care Teams.   These Care Teams include your primary Cardiologist (physician) and Advanced Practice Providers (APPs -  Physician Assistants and Nurse Practitioners) who all work together to provide you with the care you need, when you need it.  Your next appointment:   9 month(s)  Provider:   Eugenia Eldredge, DO     Other Instructions:      Adopting a Healthy Lifestyle.  Know what a healthy weight is for you (roughly BMI <25) and aim to maintain this   Aim for 7+ servings of fruits and vegetables daily   65-80+ fluid ounces of water or unsweet tea for healthy kidneys   Limit to max 1 drink of alcohol per day; avoid smoking/tobacco   Limit animal fats in diet for cholesterol and heart health - choose grass fed whenever available   Avoid highly processed foods, and foods high in saturated/trans fats   Aim for low stress - take time to unwind and care for your mental health   Aim for 150 min of moderate intensity exercise weekly for heart health, and weights twice weekly for bone health   Aim for 7-9 hours of sleep daily   When it comes to diets, agreement about the perfect plan isnt easy to find, even among the experts. Experts at the [facility] of [employer] developed an idea known as the Healthy Eating Plate. Just imagine a plate divided into logical, healthy portions.   The emphasis is on diet quality:   Load up on vegetables and fruits - one-half of your plate: Aim for color and variety, and remember that potatoes dont count.   Go for whole grains - one-quarter of your plate: Whole wheat, barley, wheat berries, quinoa, oats, brown rice, and foods made with them. If you want pasta, go with whole wheat pasta.   Protein power - one-quarter of your plate: Fish, chicken, beans, and nuts are  all healthy, versatile protein sources. Limit red meat.   The diet, however, does go beyond the plate, offering a few other suggestions.   Use healthy plant oils, such as olive, canola, soy, corn,  sunflower and peanut. Check the labels, and avoid partially hydrogenated oil, which have unhealthy trans fats.   If youre thirsty, drink water. Coffee and tea are good in moderation, but skip sugary drinks and limit milk and dairy products to one or two daily servings.   The type of carbohydrate in the diet is more important than the amount. Some sources of carbohydrates, such as vegetables, fruits, whole grains, and beans-are healthier than others.   Finally, stay active  Signed, Dub Huntsman, DO  09/24/2023 7:04 PM    [facility]

## 2023-12-07 ENCOUNTER — Telehealth: Payer: Self-pay

## 2023-12-07 NOTE — Telephone Encounter (Signed)
**Note De-Identified Cezar Misiaszek Obfuscation** Ordering provider: Dr Servando Salina Associated diagnoses: Somnolence-R40.0 and Cluster headache syndrome-G44.009  WatchPAT PA obtained on 12/07/2023 by Othell Jaime, Lorelle Formosa, LPN. Authorization: Per the [place] Preferred website: Does not require a prior authorization [place] PREFERRED does not require a prior authorization for CPT Code: 16109 ([facility]-HST).  Patient notified of PIN (1234) on 12/07/2023 Jaselyn Nahm Notification Method: phone.  Phone note routed to covering staff for follow-up.

## 2024-06-28 ENCOUNTER — Encounter: Payer: Self-pay | Admitting: Obstetrics and Gynecology

## 2024-06-28 DIAGNOSIS — E049 Nontoxic goiter, unspecified: Secondary | ICD-10-CM

## 2024-07-03 ENCOUNTER — Other Ambulatory Visit: Payer: Self-pay | Admitting: Obstetrics and Gynecology

## 2024-07-03 DIAGNOSIS — D259 Leiomyoma of uterus, unspecified: Secondary | ICD-10-CM

## 2024-07-04 ENCOUNTER — Ambulatory Visit
Admission: RE | Admit: 2024-07-04 | Discharge: 2024-07-04 | Disposition: A | Discharge status: Home / Self Care | Source: Ambulatory Visit | Attending: Obstetrics and Gynecology | Admitting: Obstetrics and Gynecology

## 2024-07-04 DIAGNOSIS — D259 Leiomyoma of uterus, unspecified: Secondary | ICD-10-CM
